# Patient Record
Sex: Female | Born: 1950 | Race: White | Hispanic: No | Smoking: Former smoker
Health system: Southern US, Community
[De-identification: ages and names within clinical notes are randomized; demographics above are authoritative.]

## PROBLEM LIST (undated history)

## (undated) DIAGNOSIS — F32A Depression, unspecified: Secondary | ICD-10-CM

## (undated) DIAGNOSIS — G8929 Other chronic pain: Secondary | ICD-10-CM

## (undated) DIAGNOSIS — F329 Major depressive disorder, single episode, unspecified: Secondary | ICD-10-CM

## (undated) DIAGNOSIS — M549 Dorsalgia, unspecified: Secondary | ICD-10-CM

## (undated) DIAGNOSIS — M810 Age-related osteoporosis without current pathological fracture: Secondary | ICD-10-CM

## (undated) DIAGNOSIS — F419 Anxiety disorder, unspecified: Secondary | ICD-10-CM

## (undated) DIAGNOSIS — R339 Retention of urine, unspecified: Secondary | ICD-10-CM

## (undated) DIAGNOSIS — M4120 Other idiopathic scoliosis, site unspecified: Secondary | ICD-10-CM

## (undated) DIAGNOSIS — K219 Gastro-esophageal reflux disease without esophagitis: Secondary | ICD-10-CM

## (undated) DIAGNOSIS — F1020 Alcohol dependence, uncomplicated: Secondary | ICD-10-CM

## (undated) DIAGNOSIS — K838 Other specified diseases of biliary tract: Secondary | ICD-10-CM

## (undated) DIAGNOSIS — M1990 Unspecified osteoarthritis, unspecified site: Secondary | ICD-10-CM

## (undated) HISTORY — PX: APPENDECTOMY: SHX54

## (undated) HISTORY — DX: Unspecified osteoarthritis, unspecified site: M19.90

## (undated) HISTORY — DX: Other specified diseases of biliary tract: K83.8

## (undated) HISTORY — PX: EUS: SHX5427

## (undated) HISTORY — DX: Other chronic pain: G89.29

## (undated) HISTORY — DX: Major depressive disorder, single episode, unspecified: F32.9

## (undated) HISTORY — DX: Alcohol dependence, uncomplicated: F10.20

## (undated) HISTORY — DX: Dorsalgia, unspecified: M54.9

## (undated) HISTORY — DX: Other idiopathic scoliosis, site unspecified: M41.20

## (undated) HISTORY — PX: ABDOMINAL HYSTERECTOMY: SHX81

## (undated) HISTORY — DX: Age-related osteoporosis without current pathological fracture: M81.0

## (undated) HISTORY — DX: Depression, unspecified: F32.A

## (undated) HISTORY — DX: Retention of urine, unspecified: R33.9

## (undated) HISTORY — DX: Gastro-esophageal reflux disease without esophagitis: K21.9

## (undated) HISTORY — DX: Anxiety disorder, unspecified: F41.9

---

## 1992-04-03 HISTORY — PX: SHOULDER OPEN ROTATOR CUFF REPAIR: SHX2407

## 1996-04-03 HISTORY — PX: BREAST ENHANCEMENT SURGERY: SHX7

## 1999-07-15 ENCOUNTER — Encounter: Payer: Self-pay | Admitting: Emergency Medicine

## 1999-07-15 ENCOUNTER — Emergency Department (HOSPITAL_COMMUNITY): Admission: EM | Admit: 1999-07-15 | Discharge: 1999-07-15 | Payer: Self-pay | Admitting: Emergency Medicine

## 1999-07-15 ENCOUNTER — Encounter (INDEPENDENT_AMBULATORY_CARE_PROVIDER_SITE_OTHER): Payer: Self-pay | Admitting: *Deleted

## 2000-03-21 ENCOUNTER — Encounter (INDEPENDENT_AMBULATORY_CARE_PROVIDER_SITE_OTHER): Payer: Self-pay | Admitting: *Deleted

## 2000-03-21 ENCOUNTER — Encounter: Payer: Self-pay | Admitting: Gastroenterology

## 2000-03-21 ENCOUNTER — Encounter: Admission: RE | Admit: 2000-03-21 | Discharge: 2000-03-21 | Payer: Self-pay | Admitting: Gastroenterology

## 2003-01-02 HISTORY — PX: BACK SURGERY: SHX140

## 2003-01-29 ENCOUNTER — Ambulatory Visit (HOSPITAL_COMMUNITY): Admission: RE | Admit: 2003-01-29 | Discharge: 2003-01-29 | Payer: Self-pay | Admitting: Neurological Surgery

## 2003-06-05 ENCOUNTER — Other Ambulatory Visit: Admission: RE | Admit: 2003-06-05 | Discharge: 2003-06-05 | Payer: Self-pay | Admitting: Internal Medicine

## 2003-06-05 ENCOUNTER — Encounter: Payer: Self-pay | Admitting: Internal Medicine

## 2003-09-21 ENCOUNTER — Encounter: Admission: RE | Admit: 2003-09-21 | Discharge: 2003-09-21 | Payer: Self-pay | Admitting: Neurological Surgery

## 2004-06-09 ENCOUNTER — Ambulatory Visit: Payer: Self-pay | Admitting: Internal Medicine

## 2005-03-23 ENCOUNTER — Ambulatory Visit: Payer: Self-pay | Admitting: Internal Medicine

## 2005-04-11 ENCOUNTER — Ambulatory Visit: Payer: Self-pay | Admitting: Internal Medicine

## 2005-07-14 ENCOUNTER — Ambulatory Visit: Payer: Self-pay | Admitting: Internal Medicine

## 2005-08-18 ENCOUNTER — Ambulatory Visit: Payer: Self-pay | Admitting: Internal Medicine

## 2005-10-10 ENCOUNTER — Ambulatory Visit: Payer: Self-pay | Admitting: Internal Medicine

## 2005-11-13 ENCOUNTER — Ambulatory Visit: Payer: Self-pay | Admitting: General Surgery

## 2005-11-14 ENCOUNTER — Ambulatory Visit: Payer: Self-pay | Admitting: General Surgery

## 2006-01-30 ENCOUNTER — Ambulatory Visit: Payer: Self-pay | Admitting: Internal Medicine

## 2006-12-19 ENCOUNTER — Encounter: Payer: Self-pay | Admitting: Internal Medicine

## 2006-12-28 ENCOUNTER — Encounter: Payer: Self-pay | Admitting: Internal Medicine

## 2007-02-07 ENCOUNTER — Telehealth: Payer: Self-pay | Admitting: Internal Medicine

## 2007-02-08 ENCOUNTER — Ambulatory Visit: Payer: Self-pay | Admitting: Family Medicine

## 2007-02-08 DIAGNOSIS — R339 Retention of urine, unspecified: Secondary | ICD-10-CM | POA: Insufficient documentation

## 2007-02-08 DIAGNOSIS — R5381 Other malaise: Secondary | ICD-10-CM | POA: Insufficient documentation

## 2007-02-08 DIAGNOSIS — R5383 Other fatigue: Secondary | ICD-10-CM

## 2007-02-08 LAB — CONVERTED CEMR LAB
Basophils Absolute: 0.1 10*3/uL (ref 0.0–0.1)
Basophils Relative: 0.7 % (ref 0.0–1.0)
Bilirubin, Direct: 0.1 mg/dL (ref 0.0–0.3)
CO2: 31 meq/L (ref 19–32)
Calcium: 9.8 mg/dL (ref 8.4–10.5)
Chloride: 101 meq/L (ref 96–112)
Creatinine, Ser: 0.6 mg/dL (ref 0.4–1.2)
GFR calc Af Amer: 133 mL/min
Glucose, Bld: 66 mg/dL — ABNORMAL LOW (ref 70–99)
HCT: 42.1 % (ref 36.0–46.0)
Ketones, urine, test strip: NEGATIVE
Lymphocytes Relative: 26.9 % (ref 12.0–46.0)
MCHC: 33.9 g/dL (ref 30.0–36.0)
MCV: 91.7 fL (ref 78.0–100.0)
Nitrite: NEGATIVE
Potassium: 4.8 meq/L (ref 3.5–5.1)
Sodium: 141 meq/L (ref 135–145)
Specific Gravity, Urine: 1.01
Urobilinogen, UA: NEGATIVE
WBC: 10.2 10*3/uL (ref 4.5–10.5)
pH: 7.5

## 2007-02-11 ENCOUNTER — Encounter: Payer: Self-pay | Admitting: Internal Medicine

## 2007-03-12 ENCOUNTER — Ambulatory Visit: Payer: Self-pay | Admitting: Internal Medicine

## 2007-04-12 ENCOUNTER — Telehealth (INDEPENDENT_AMBULATORY_CARE_PROVIDER_SITE_OTHER): Payer: Self-pay | Admitting: *Deleted

## 2007-05-16 ENCOUNTER — Telehealth (INDEPENDENT_AMBULATORY_CARE_PROVIDER_SITE_OTHER): Payer: Self-pay | Admitting: *Deleted

## 2007-05-17 ENCOUNTER — Ambulatory Visit: Payer: Self-pay | Admitting: Internal Medicine

## 2007-05-17 DIAGNOSIS — S93409A Sprain of unspecified ligament of unspecified ankle, initial encounter: Secondary | ICD-10-CM | POA: Insufficient documentation

## 2007-05-23 ENCOUNTER — Encounter: Payer: Self-pay | Admitting: Internal Medicine

## 2007-07-30 ENCOUNTER — Ambulatory Visit: Payer: Self-pay | Admitting: Obstetrics and Gynecology

## 2008-07-09 ENCOUNTER — Encounter: Payer: Self-pay | Admitting: Internal Medicine

## 2008-07-21 ENCOUNTER — Encounter: Payer: Self-pay | Admitting: Internal Medicine

## 2008-07-21 DIAGNOSIS — M4120 Other idiopathic scoliosis, site unspecified: Secondary | ICD-10-CM | POA: Insufficient documentation

## 2008-07-21 DIAGNOSIS — F39 Unspecified mood [affective] disorder: Secondary | ICD-10-CM | POA: Insufficient documentation

## 2008-07-21 DIAGNOSIS — R1011 Right upper quadrant pain: Secondary | ICD-10-CM | POA: Insufficient documentation

## 2008-07-22 ENCOUNTER — Ambulatory Visit: Payer: Self-pay | Admitting: Internal Medicine

## 2008-07-22 ENCOUNTER — Encounter: Payer: Self-pay | Admitting: Gastroenterology

## 2008-07-22 DIAGNOSIS — F1910 Other psychoactive substance abuse, uncomplicated: Secondary | ICD-10-CM | POA: Insufficient documentation

## 2008-07-22 DIAGNOSIS — F1011 Alcohol abuse, in remission: Secondary | ICD-10-CM | POA: Insufficient documentation

## 2008-07-22 LAB — CONVERTED CEMR LAB
ALT: 24 units/L (ref 0–35)
Albumin: 4.4 g/dL (ref 3.5–5.2)
Amylase: 43 units/L (ref 27–131)
INR: 1 (ref 0.8–1.0)
Lipase: 19 units/L (ref 11.0–59.0)
Total Bilirubin: 0.4 mg/dL (ref 0.3–1.2)
Total Protein: 7.5 g/dL (ref 6.0–8.3)

## 2008-07-23 ENCOUNTER — Ambulatory Visit (HOSPITAL_COMMUNITY): Admission: RE | Admit: 2008-07-23 | Discharge: 2008-07-23 | Payer: Self-pay | Admitting: Gastroenterology

## 2008-07-23 ENCOUNTER — Ambulatory Visit: Payer: Self-pay | Admitting: Gastroenterology

## 2008-08-03 ENCOUNTER — Telehealth: Payer: Self-pay | Admitting: Internal Medicine

## 2008-09-03 ENCOUNTER — Ambulatory Visit: Payer: Self-pay | Admitting: Internal Medicine

## 2008-09-03 DIAGNOSIS — K6289 Other specified diseases of anus and rectum: Secondary | ICD-10-CM | POA: Insufficient documentation

## 2008-09-07 ENCOUNTER — Encounter: Payer: Self-pay | Admitting: Gastroenterology

## 2008-09-24 ENCOUNTER — Telehealth (INDEPENDENT_AMBULATORY_CARE_PROVIDER_SITE_OTHER): Payer: Self-pay | Admitting: *Deleted

## 2008-09-24 ENCOUNTER — Encounter: Payer: Self-pay | Admitting: Gastroenterology

## 2008-09-24 DIAGNOSIS — K831 Obstruction of bile duct: Secondary | ICD-10-CM | POA: Insufficient documentation

## 2008-09-30 ENCOUNTER — Telehealth: Payer: Self-pay | Admitting: Cardiology

## 2008-10-15 ENCOUNTER — Ambulatory Visit: Payer: Self-pay | Admitting: Gastroenterology

## 2008-10-15 ENCOUNTER — Encounter (INDEPENDENT_AMBULATORY_CARE_PROVIDER_SITE_OTHER): Payer: Self-pay | Admitting: *Deleted

## 2008-10-15 ENCOUNTER — Ambulatory Visit (HOSPITAL_COMMUNITY): Admission: RE | Admit: 2008-10-15 | Discharge: 2008-10-15 | Payer: Self-pay | Admitting: Gastroenterology

## 2008-10-22 ENCOUNTER — Ambulatory Visit: Payer: Self-pay | Admitting: Internal Medicine

## 2009-03-11 ENCOUNTER — Telehealth: Payer: Self-pay | Admitting: Internal Medicine

## 2009-03-12 ENCOUNTER — Encounter: Payer: Self-pay | Admitting: Internal Medicine

## 2009-04-01 ENCOUNTER — Telehealth: Payer: Self-pay | Admitting: Internal Medicine

## 2009-04-05 ENCOUNTER — Ambulatory Visit: Payer: Self-pay | Admitting: Internal Medicine

## 2009-04-05 DIAGNOSIS — K5900 Constipation, unspecified: Secondary | ICD-10-CM | POA: Insufficient documentation

## 2009-04-05 DIAGNOSIS — R1031 Right lower quadrant pain: Secondary | ICD-10-CM | POA: Insufficient documentation

## 2009-04-06 ENCOUNTER — Encounter: Payer: Self-pay | Admitting: Internal Medicine

## 2009-04-07 ENCOUNTER — Ambulatory Visit: Payer: Self-pay | Admitting: Cardiovascular Disease

## 2009-04-08 LAB — CONVERTED CEMR LAB
AST: 28 units/L (ref 0–37)
Albumin: 4.2 g/dL (ref 3.5–5.2)
BUN: 12 mg/dL (ref 6–23)
Basophils Absolute: 0.1 10*3/uL (ref 0.0–0.1)
CO2: 33 meq/L — ABNORMAL HIGH (ref 19–32)
Calcium: 9.6 mg/dL (ref 8.4–10.5)
Chloride: 98 meq/L (ref 96–112)
Creatinine, Ser: 0.6 mg/dL (ref 0.4–1.2)
Eosinophils Relative: 1.2 % (ref 0.0–5.0)
GFR calc non Af Amer: 109.41 mL/min (ref >60)
HCT: 40.3 % (ref 36.0–46.0)
MCHC: 33.4 g/dL (ref 30.0–36.0)
Monocytes Absolute: 0.4 10*3/uL (ref 0.1–1.0)
Monocytes Relative: 5.2 % (ref 3.0–12.0)
Neutro Abs: 3.9 10*3/uL (ref 1.4–7.7)
Potassium: 4.6 meq/L (ref 3.5–5.1)
RDW: 13.1 % (ref 11.5–14.6)
Sodium: 138 meq/L (ref 135–145)

## 2009-04-09 ENCOUNTER — Telehealth: Payer: Self-pay | Admitting: Internal Medicine

## 2009-04-15 ENCOUNTER — Telehealth: Payer: Self-pay | Admitting: Internal Medicine

## 2009-04-29 ENCOUNTER — Telehealth: Payer: Self-pay | Admitting: Internal Medicine

## 2009-05-07 ENCOUNTER — Ambulatory Visit: Payer: Self-pay | Admitting: Internal Medicine

## 2009-05-07 ENCOUNTER — Ambulatory Visit (HOSPITAL_COMMUNITY): Admission: RE | Admit: 2009-05-07 | Discharge: 2009-05-07 | Payer: Self-pay | Admitting: Internal Medicine

## 2009-05-07 LAB — HM COLONOSCOPY

## 2010-02-11 ENCOUNTER — Ambulatory Visit: Payer: Self-pay | Admitting: Internal Medicine

## 2010-02-11 DIAGNOSIS — M19049 Primary osteoarthritis, unspecified hand: Secondary | ICD-10-CM | POA: Insufficient documentation

## 2010-02-14 LAB — CONVERTED CEMR LAB: Anti Nuclear Antibody(ANA): NEGATIVE

## 2010-02-15 ENCOUNTER — Telehealth: Payer: Self-pay | Admitting: Internal Medicine

## 2010-02-15 LAB — CONVERTED CEMR LAB
ALT: 25 units/L (ref 0–35)
AST: 28 units/L (ref 0–37)
BUN: 13 mg/dL (ref 6–23)
Basophils Relative: 1.5 % (ref 0.0–3.0)
Chloride: 99 meq/L (ref 96–112)
Creatinine, Ser: 0.6 mg/dL (ref 0.4–1.2)
Eosinophils Relative: 6.3 % — ABNORMAL HIGH (ref 0.0–5.0)
GFR calc non Af Amer: 101.25 mL/min (ref >60)
HCT: 38.7 % (ref 36.0–46.0)
Hemoglobin: 13.1 g/dL (ref 12.0–15.0)
MCHC: 34 g/dL (ref 30.0–36.0)
MCV: 90.8 fL (ref 78.0–100.0)
Phosphorus: 4.1 mg/dL (ref 2.3–4.6)
Potassium: 4 meq/L (ref 3.5–5.1)
RBC: 4.26 M/uL (ref 3.87–5.11)
Sed Rate: 8 mm/hr (ref 0–22)
Total Bilirubin: 0.5 mg/dL (ref 0.3–1.2)

## 2010-02-16 ENCOUNTER — Encounter: Payer: Self-pay | Admitting: Internal Medicine

## 2010-03-11 ENCOUNTER — Telehealth: Payer: Self-pay | Admitting: Internal Medicine

## 2010-03-15 ENCOUNTER — Ambulatory Visit: Payer: Self-pay | Admitting: Internal Medicine

## 2010-04-25 ENCOUNTER — Telehealth: Payer: Self-pay | Admitting: Internal Medicine

## 2010-05-03 NOTE — Assessment & Plan Note (Signed)
Summary: RE-ESTABLISH, PAIN IN JOINTS   Vital Signs:  Patient profile:   60 year old female Height:      64.5 inches Weight:      104 pounds BMI:     17.64 Temp:     98.0 degrees F oral Pulse rate:   76 / minute Pulse rhythm:   regular BP sitting:   110 / 60  (left arm) Cuff size:   regular  Vitals Entered By: Mervin Hack CMA Duncan Dull) (February 11, 2010 12:13 PM) CC: pain in joints, re-establish care   History of Present Illness: Reestablishing here Had been seen once at [facility] in [place]. Wasn't happy there Sees Dr Synetta Shadow at [facility] pain clinic Chronic pain patient has reasonable coontrol does get worsening several months after caudal steroid shots---gets about every 5 months  Has noted rapid, fast moving arthritis Had pain in hands all summer then noted increasing stiffness in AM--loosens up in 15 minutes or so Pain keeping her up at night despite her meds elbows, knees and feet at times---moves around very fast  some help from accupuncturist  Under a lot of stress Cares for 23 year old father--has to cook for him, etc. Lives 5 minutes away Marriage isn't very good  No preceeding viral or other infection  No photosensitivity No swallowing problems  no rashes  Allergies: 1)  ! Sulfa  Past History:  Past medical, surgical, family and social histories (including risk factors) reviewed for relevance to current acute and chronic problems.  Past Medical History: Chronic back pain--disabled     Followed at pain clinic SCOLIOSIS (ICD-737.30) ANXIETY (ICD-300.00) * COMMON BILE DUCT DILATION URINARY RETENTION (ICD-788.20) Alcoholism Arthritis Depression  Past Surgical History: Reviewed history from 04/05/2009 and no changes required. 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 Abd problems that she is seeing GI for  Physical Exam  General:  alert.  NAD Head:  normocephalic and atraumatic.   Neck:  supple, no masses, no thyromegaly, no carotid bruits, and no cervical lymphadenopathy.   Lungs:  normal respiratory effort, no intercostal retractions, no accessory muscle use, and normal breath sounds.   Heart:  normal rate, regular rhythm, no murmur, and no gallop.   Abdomen:  soft, non-tender, no hepatomegaly, and no splenomegaly.   Msk:  symmetric mild synovitis in 2-5th PIPs in both hands all other joints are quiet --though occ mild tenderness Extremities:  No edema Skin:  no rashes.   Psych:  normally interactive, good eye contact, and dysphoric affect.     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) Assessment New x-rays look okay to me but awaiting radiologist symmetric small joints suggestive of RA multiple other pains as well  will try NSAIDs--she does have some upset stomach at times if tests all negative but ongoing symptoms, will send to  rheumatology she is very wary of disease modifying regimens though I told her they could be very successful  Orders: T-Hand Left 3 Views (73130TC) T-Hand Right 3 views (73130TC) Venipuncture (44010) TLB-Renal Function Panel (80069-RENAL) TLB-CBC Platelet - w/Differential (85025-CBCD) TLB-Hepatic/Liver  Function Pnl (80076-HEPATIC) TLB-TSH (Thyroid Stimulating Hormone) (84443-TSH) Specimen Handling (27253) TLB-Sedimentation Rate (ESR) (85652-ESR) T-Antinuclear Antib (ANA) (66440-34742) T-Rheumatoid Factor (59563-87564)  Complete Medication List: 1)  Klonopin 0.5 Mg Tabs (Clonazepam) .... Take 1 tablet by mouth once a day 2)  Methadone Hcl 10 Mg Tabs (Methadone hcl) .... Take 2 by mouth in the morning, take 1 by mouth in the afternoon, take 2 by mouth at bedtime 3)  Hydrocodone-acetaminophen 7.5-750 Mg Tabs (Hydrocodone-acetaminophen) .... Two times a day 4)  Vitamin E 400 Unit Caps (Vitamin e) .... Take 1 capsule by mouth once a day 5)  Calcium-vitamin D 600-200 Mg-unit Tabs (Calcium-vitamin d) .... Once daily 6)  Vitamin B-6 50 Mg Tabs (Pyridoxine hcl) .... Once daily 7)  Ra Gas Relief Maximum Strength 125 Mg Chew (Simethicone) .... Once daily 8)  Miralax Powd (Polyethylene glycol 3350) .... Take 1 capful dissolved in water/juice once daily.  Patient Instructions: 1)  Please try aleve (naproxen sodium 220mg ) 1-2 tabs after breakfast and supper 2)  Please schedule a follow-up appointment in 1 month.    Orders Added: 1)  T-Hand Left 3 Views [73130TC] 2)  T-Hand Right 3 views [73130TC] 3)  Venipuncture [36415] 4)  TLB-Renal Function Panel [80069-RENAL] 5)  TLB-CBC Platelet - w/Differential [85025-CBCD] 6)  TLB-Hepatic/Liver Function Pnl [80076-HEPATIC] 7)  TLB-TSH (Thyroid Stimulating Hormone) [84443-TSH] 8)  Specimen Handling [99000] 9)  TLB-Sedimentation Rate (ESR) [85652-ESR] 10)  T-Antinuclear Antib (ANA) [33295-18841] 11)  T-Rheumatoid Factor [66063-01601] 12)  Est. Patient Level IV [09323]    Current Allergies (reviewed today): ! SULFA

## 2010-05-03 NOTE — Progress Notes (Signed)
Summary: regarding lab results  Phone Note Call from Patient Call back at Home Phone [phone]   Caller: Patient Call For: Cindee Salt MD Summary of Call: Advised pt of lab results.  She says she cant take the aleve because it upsets her stomach.  She cant sleep at night because she breaks out in sweats  and her hands hurt so bad.  She will try the aleve again tonight but asks if you have any other ideas. Initial call taken by: Lowella Petties CMA, AAMA,  February 15, 2010 11:11 AM  Follow-up for Phone Call        she needs to come in so we can decide on a treatment for her I would recommend methotrexate she should try ibuprofen 1-2 three times a day with meals also Cindee Salt MD  February 15, 2010 11:34 AM   Advised pt. She wants to hold off on appt.  she said she will try to eat more and take the aleve but she is asking for something to help her sleep, says she is up all night with her hand pain.  She doesnt want to start methotrexate at this time, she is getting accupunture treatments and wants to continue those instead. Follow-up by: Lowella Petties CMA, AAMA,  February 15, 2010 12:13 PM  Additional Follow-up for Phone Call Additional follow up Details #1::        okay to refill the hydrocodone #60 x 0 then  Cindee Salt MD  February 15, 2010 1:39 PM   Pt states she gets her vicodin from the pain clinic.  She is asking for something for sleep.  [facility] [place] Additional Follow-up by: Lowella Petties CMA, AAMA,  February 15, 2010 3:16 PM    Additional Follow-up for Phone Call Additional follow up Details #2::    That's right Have her try trazodone 50mg   1-2 at bedtime to help sleep #60 x 1 Cindee Salt MD  February 16, 2010 7:57 AM    rx faxed to pharmacy, left message on machine that rx was sent to pharmacy, advised pt to call if any questions. Follow-up by: Mervin Hack CMA Duncan Dull),  February 16, 2010 3:57 PM  New/Updated  Medications: TRAZODONE HCL 50 MG TABS (TRAZODONE HCL) 1-2 at bedtime to help sleep Prescriptions: TRAZODONE HCL 50 MG TABS (TRAZODONE HCL) 1-2 at bedtime to help sleep  #60 x 1   Entered by:   Mervin Hack CMA (AAMA)   Authorized by:   Cindee Salt MD   Signed by:   Mervin Hack CMA (AAMA) on 02/16/2010   Method used:   [facility]  [address] [address] [phone][facility]       [address]       [place], [place]  [address]       Ph: [phone] or [phone]       Fax: [phone]   RxID:   [id number]

## 2010-05-03 NOTE — Procedures (Signed)
Summary: Colonoscopy  Patient: Katherine [name] Note: All result statuses are Final unless otherwise noted.  Tests: (1) Colonoscopy (COL)   COL Colonoscopy           DONE     [facility]     [address] [place], [place]  [address]           COLONOSCOPY PROCEDURE REPORT           PATIENT:  [name], [name]  MR#:  [id number]     BIRTHDATE:  1950/06/30, 58 yrs. old  GENDER:  female           ENDOSCOPIST:  Hedwig Morton. Juanda Chance, MD     Referred by:           PROCEDURE DATE:  05/07/2009     PROCEDURE:  Colonoscopy 19147     ASA CLASS:  Class II     INDICATIONS:  abdominal pain RLQ abd. pain, chronic constipation,     on Methadone for chronic pain           MEDICATIONS:   See Anesthesia Report., Versed 2 mg, Fentanyl 50     mcg, propofol (Diprivan) 220 mg           DESCRIPTION OF PROCEDURE:   After the risks benefits and     alternatives of the procedure were thoroughly explained, informed     consent was obtained.  Digital rectal exam was performed and     revealed no rectal masses.   The  endoscope was introduced through     the anus and advanced to the cecum, which was identified by both     the appendix and ileocecal valve, without limitations.  The     quality of the prep was excellent, using MiraLax.  The instrument     was then slowly withdrawn as the colon was fully examined.     <<PROCEDUREIMAGES>>           FINDINGS:  No polyps or cancers were seen (see image1, image2,     image3, and image4).   Retroflexed views in the rectum revealed no     abnormalities.    The scope was then withdrawn from the patient     and the procedure completed.           COMPLICATIONS:  None           ENDOSCOPIC IMPRESSION:     1) No polyps or cancers     2) Normal colonoscopy     RECOMMENDATIONS:     1) high fiber diet     Miralax 17 gm po qd for constipation           REPEAT EXAM:  In 10 year(s) for.           ______________________________     Hedwig Morton. Juanda Chance, MD        CC:           n.     eSIGNED:   Hedwig Morton. Brodie at 05/07/2009 09:44 AM           Scarlata, Madrone, [phone]  Note: An exclamation mark (!) indicates a result that was not dispersed into the flowsheet. Document Creation Date: 05/07/2009 9:45 AM _______________________________________________________________________  (1) Order result status: Final Collection or observation date-time: 05/07/2009 09:27 Requested date-time:  Receipt date-time:  Reported date-time:  Referring Physician:   Ordering Physician: Lina Sar [phone]) Specimen Source:  Source: Launa Grill  Order Number: [id number] Lab site:   Appended Document: Colonoscopy Recall is in [facility] for 05/2019.

## 2010-05-03 NOTE — Letter (Signed)
Summary: [facility] Instructions  [facility]  [address] [place], [place] [address]   Phone: [phone]  Fax: [phone]       Katherine [name]    26-Dec-1950    MRN: [id number]       Procedure Day /Date:05-07-09     Arrival Time: 7:00 AM     Procedure Time:8:30 AM     Location of Procedure:                     X     [facility] ( Outpatient Registration)      PREPARATION FOR COLONOSCOPY WITH MIRALAX  Starting 5 days prior to your procedure 05-02-09 do not eat nuts, seeds, popcorn, corn, beans, peas,  salads, or any raw vegetables.  Do not take any fiber supplements (e.g. Metamucil, Citrucel, and Benefiber). ____________________________________________________________________________________________________   THE DAY BEFORE YOUR PROCEDURE         DATE:05-06-09 DAY: Thursday  You should come to the Admitting department at [facility] at 1:00 PM for directions to the Endoscopy Unit on the 1st floor for the Preappointment regarding Anesthesia.  1   Drink clear liquids the entire day-NO SOLID FOOD  2   Do not drink anything colored red or purple.  Avoid juices with pulp.  No orange juice.  3   Drink at least 64 oz. (8 glasses) of fluid/clear liquids during the day to prevent dehydration and help the prep work efficiently.  CLEAR LIQUIDS INCLUDE: Water Jello Ice Popsicles Tea (sugar ok, no milk/cream) Powdered fruit flavored drinks Coffee (sugar ok, no milk/cream) Gatorade Juice: apple, white grape, white cranberry  Lemonade Clear bullion, consomm, broth Carbonated beverages (any kind) Strained chicken noodle soup Hard Candy  4   Mix the entire bottle of Miralax with 64 oz. of Gatorade/Powerade in the morning and put in the refrigerator to chill.  5   At 3:00 pm take 2 Dulcolax/Bisacodyl tablets.  6   At 4:30 pm take one Reglan/Metoclopramide tablet.  7  Starting at 5:00 pm drink one 8 oz glass of the Miralax mixture every 15-20 minutes  until you have finished drinking the entire 64 oz.  You should finish drinking prep around 7:30 or 8:00 pm.  8   If you are nauseated, you may take the 2nd Reglan/Metoclopramide tablet at 6:30 pm.        9    At 8:00 pm take 2 more DULCOLAX/Bisacodyl tablets.     THE DAY OF YOUR PROCEDURE      DATE:  05-07-09 DAY: Friday  You may drink clear liquids until Midnight.   MEDICATION INSTRUCTIONS  Unless otherwise instructed, you should take regular prescription medications with a small sip of water as early as possible the morning of your procedure.       OTHER INSTRUCTIONS  You will need a responsible adult at least 59 years of age to accompany you and drive you home.   This person must remain in the waiting room during your procedure.  Wear loose fitting clothing that is easily removed.  Leave jewelry and other valuables at home.  However, you may wish to bring a book to read or an iPod/MP3 player to listen to music as you wait for your procedure to start.  Remove all body piercing jewelry and leave at home.  Total time from sign-in until discharge is approximately 2-3 hours.  You should go home directly after your procedure and rest.  You  can resume normal activities the day after your procedure.  The day of your procedure you should not:   Drive   Make legal decisions   Operate machinery   Drink alcohol   Return to work  You will receive specific instructions about eating, activities and medications before you leave.   The above instructions have been reviewed and explained to me by   _______________________    I fully understand and can verbalize these instructions _____________________________ Date _______

## 2010-05-03 NOTE — Assessment & Plan Note (Signed)
Summary: F/U FROM TRIAGE ON 03-11-09, CONTINUES WITH SEVERE PAIN (DR.BR...   History of Present Illness Visit Type: Follow-up Visit Primary GI MD: Lina Sar MD Primary Provider: Darrel Hoover, DO Chief Complaint: abdominal pain with protusion History of Present Illness:   60 YO FEMALE WITH MULTIPLE MEDICAL PROBLEMS,KNOWN TO DR Juanda Chance. SHE UNDERWENT EUS AND ERCP WITH STENT PLACEMENT PER DR JACOBS IN 4/10 FOR A DISTAL BILE DUCT STRICTURE. STENT WAS REMOVED AND SHINCTEROTOMY WAS EXTENDED IN 7 /10. SHE COMES BACK TODAY WITH A DIFFERENT PROBLEM . SHE SAYS SHE HAD RECURRENCE OF HER RUQ PAIN AFTER THE STANT WAS REMOVED BUT IT HAS BEEN TOLERABLE,NOT SEVERE-SOME DAYS WORSE THAN OTHERS.. SHE HAS A NEW PAIN IN THE RLQ [facility] HAS BEEN BOTHERING HER ALOT OVER THE PAST COUPLE MONTHS. THIS PAIN IS FAIRLY CONSTANT,IS WORSE POST PRANDIALLY,RADIATES AROUND IN TO HER BACK AT TIMES. SHE FEELS SHE HAS A PROTRUSION IN THE RLQ. SHE HAS CHRONIC CONSTIPATION,USES MIRALAX DAILY SOMETIMES HAS TO USE OTHER LAXATIVE /PRUNE JUICE ETC. NO MELENA OR HEME.. SHE HAD LOST 20 POUNDS EARLIER THIS YEAR,IS WORKING HARD TO GAIN SOME BACK.SHE IS UP 3 POUNDS WITH USE OF PROTEIN SUPPLEMENTS ETC.   GI Review of Systems    Reports abdominal pain and  bloating.     Location of  Abdominal pain: RLQ.    Denies acid reflux, belching, chest pain, dysphagia with liquids, dysphagia with solids, heartburn, loss of appetite, nausea, vomiting, vomiting blood, weight loss, and  weight gain.      Reports change in bowel habits.      Current Medications (verified): 1)  Klonopin 0.5 Mg  Tabs (Clonazepam) .... Take 1 Tablet By Mouth Once A Day 2)  Methadone Hcl 10 Mg  Tabs (Methadone Hcl) .... Up To 40 Mg. Once Daily 3)  Miralax   Powd (Polyethylene Glycol 3350) .... Once Daily 4)  Skelaxin 800 Mg Tabs (Metaxalone) .... One Tablet By Mouth Once A Week 5)  Hydrocodone-Acetaminophen 7.5-750 Mg Tabs (Hydrocodone-Acetaminophen) .... Two Times A  Day 6)  Lorazepam 0.5 Mg Tabs (Lorazepam) .... One Tablet By Mouth At Bedtime 7)  Caudle Steroid Injections .... Every 5 Months 8)  Vitamin E 400 Unit Caps (Vitamin E) .... Take 1 Capsule By Mouth Once A Day 9)  Calcium-Vitamin D 600-200 Mg-Unit Tabs (Calcium-Vitamin D) .... Once Daily 10)  Vitamin B-6 50 Mg Tabs (Pyridoxine Hcl) .... Once Daily 11)  Ra Gas Relief Maximum Strength 125 Mg Chew (Simethicone) .... Once Daily  Allergies (verified): 1)  ! Sulfa  Past History:  Past Medical History: Reviewed history from 07/22/2008 and no changes required. 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 Father: bladder cancer, HTN Mother: NIDDM 2 brothers Malen Gauze died of cervical cancer CAD on Mom's side  Social History: Reviewed history from 10/22/2008 and no changes required. Marital Status: Married-going through divorce Children: none Occupation: worked part-tme at [employer] at [employer] Current Smoker--several daily Alcohol use-no  Review of Systems       The patient complains of  allergy/sinus, anxiety-new, back pain, cough, depression-new, fatigue, muscle pains/cramps, night sweats, shortness of breath, sleeping problems, and urination changes/pain.         ROS OTHERWISE AS IN HPI  Vital Signs:  Patient profile:   60 year old female Height:      65  inches Weight:      101.50 pounds BMI:     16.95 Pulse rate:   68 / minute Pulse rhythm:   regular BP sitting:   110 / 58  (left arm) Cuff size:   regular  Vitals Entered By: June McMurray CMA Duncan Dull) (April 05, 2009 2:24 PM)  Physical Exam  General:  Well developed, , no acute distress.,VERY THIN,AMBULATES WITH A CANE Head:  Normocephalic and atraumatic. Eyes:  PERRLA, no icterus. Lungs:  Clear throughout to auscultation. Heart:  Regular rate and rhythm; no murmurs, rubs,  or bruits. Abdomen:  FLAT SOFT, MILD TENDERNESS RLQ, NO HERNIA PALPABLE, NO MAS OR HSM,BS+ Rectal:  NOT DONE Neurologic:  Alert and  oriented x4;  grossly normal neurologically.weakness noted.  weakness noted.   Psych:  Alert and cooperative. Normal mood and affect.   Impression & Recommendations:  Problem # 1:  ABDOMINAL PAIN RIGHT LOWER QUADRANT (ICD-789.03) Assessment New 60 YO FEMALE WITH CHRONIC PAIN,ON METHDONE WITH RLQ PAIN X 2 MONTHS. R/O SECONDARY TO CONSTIPATION, LOW GRADE PARTIAL SBO,ADHESIONS,OCCULT LESION,R/O MUSCULOSKELETAL  LABS AS BELOW SCHEDULE FOR CT SCAN ABD/PELVIS  IF CT UNREMARKABLE WILL PROCEED WITH COLONOSCOPY WITH DR Juanda Chance. PT HAS NOT HAD ANY PRIOR COLON SCREENING. WILL NEED MAC ANESTHESIA. PROCEDURE DISCUSSED WITH PT AND HUSBAND  Orders: ZCOL (ZCOL)  Problem # 2:  CONSTIPATION (ICD-564.00) Assessment: Unchanged CHRONIC;SEEABOVE. ADVISED INCREASE CHRONULAC TO 2 DOSESDAILY AS NEEDED.  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) Assessment: Unchanged RECURRENT RUQ PAIN,NO CHANGE X SEVERAL MONTHS;HX OF DIFFUSELY DILATED CBD?SOD-S/P ERCP/SPHINCTEROTOMY/STENT 4/10 WITH STENT REMOVAL 7/10  HEPATIC PANEL TODAY CT ABDOMEN PELVIS AS ABOVE Orders: TLB-CMP (Comprehensive Metabolic Pnl) (80053-COMP) TLB-CBC Platelet - w/Differential (85025-CBCD) ZCOL (ZCOL)  Problem # 4:  FAMILY HX OF COLONIC POLYPS (ICD-V18.51) Assessment: Comment Only  Problem # 5:  ABUSE, ALCOHOL, IN  REMISSION (ICD-305.03) Assessment: Comment Only  Problem # 6:  CHRONIC PAIN SYNDROME Assessment: Comment Only  Patient Instructions: 1)  We scheduled the CT for 04-07-09 at 10:30Am. 2)  Instructions and contrast provided. 3)  We scheduled the Colonoscopy at [facility] with Dr. Lina Sar for 05-07-09 at 8:30 AM. 4)  Colonoscopy and conscous sedation brochure provided. 5)  The medication list was reviewed and reconciled.  All changed / newly prescribed medications were explained.  A complete medication list was provided to the patient / caregiver. Prescriptions: REGLAN 10 MG  TABS (METOCLOPRAMIDE HCL) As per prep instructions.  #2 x 0   Entered by:   Lowry Ram NCMA   Authorized by:   Sammuel Cooper PA-c   Signed by:   Lowry Ram NCMA on 04/05/2009   Method used:   [facility]  [address] [phone]       [address]       [place], [place]  [address]       Ph: [phone] or [phone]       Fax: [phone]   RxID:   [id number] DULCOLAX 5 MG  TBEC (BISACODYL) Day before procedure take 2 at 3pm and 2 at 8pm.  #  4 x 0   Entered by:   Lowry Ram NCMA   Authorized by:   Sammuel Cooper PA-c   Signed by:   Lowry Ram NCMA on 04/05/2009   Method used:   [facility]  [address] [phone][facility] (retail[facility]       [address]       [place], [place]  [address]       Ph: [phone] or [phone]       Fax: [phone]   RxID:   [id number] MIRALAX   POWD (POLYETHYLENE GLYCOL 3350) As per prep  instructions.  #255gm x 0   Entered by:   Lowry Ram NCMA   Authorized by:   Sammuel Cooper PA-c   Signed by:   Lowry Ram NCMA on 04/05/2009   Method used:   [facility]  [address] [phone]* (retail)       [address]       [place], [place]  [address]       Ph: [phone] or [phone]       Fax: [phone]   RxID:   [id number]

## 2010-05-03 NOTE — Miscellaneous (Signed)
Summary: CT  Clinical Lists Changes  Orders: Added new Referral order of CT Abdomen/Pelvis with Contrast (CT Abd/Pelvis w/con) - Signed

## 2010-05-03 NOTE — Procedures (Signed)
Summary: Instructions for procedure/[facility] (out pt)  Instructions for procedure/[facility] (out pt)   Imported By: Sherian Rein 04/08/2009 11:18:05  _____________________________________________________________________  External Attachment:    Type:   Image     Comment:   External Document

## 2010-05-03 NOTE — Procedures (Signed)
Summary: Recall / [facility]  Recall / [facility]   Imported By: Lennie Odor 09/03/2009 14:09:29  _____________________________________________________________________  External Attachment:    Type:   Image     Comment:   External Document

## 2010-05-03 NOTE — Progress Notes (Signed)
Summary: Refill  Phone Note Call from Patient Call back at Home Phone [phone] Call back at or [phone]   Call For: Dr Juanda Chance Reason for Call: Talk to Nurse Summary of Call: On social security and has to pay her primary a copay just so he can refill her Polyglycol. Wonders if Dr Juanda Chance can just [facility] [place] [facility] [address] Dr Juanda Chance knows she uses this. Initial call taken by: Leanor Kail [facility],  April 15, 2009 3:46 PM    New/Updated Medications: MIRALAX   POWD (POLYETHYLENE GLYCOL 3350) Take 1 capful dissolved in water/juice once daily. MUST HAVE COLONOSCOPY FOR FURTHER REFILLS! Prescriptions: MIRALAX   POWD (POLYETHYLENE GLYCOL 3350) Take 1 capful dissolved in water/juice once daily. MUST HAVE COLONOSCOPY FOR FURTHER REFILLS!  #527 grams x 0   Entered by:   Hortense Ramal CMA (AAMA)   Authorized by:   Hart Carwin MD   Signed by:   Hortense Ramal CMA (AAMA) on 04/15/2009   Method used:   [facility]        [facility]  [address] [phone][facility] (retail[facility]       [address]       [place], [place]  [address]       Ph: [phone] or [phone]       Fax: [phone]   RxID:   [id number]

## 2010-05-03 NOTE — Progress Notes (Signed)
Summary: TRIAGE  Phone Note Call from Patient Call back at Home Phone [phone]   Call For: Dr Juanda Chance Reason for Call: Lab or Test Results Summary of Call: Was her CT Scan reviewed? Initial call taken by: Leanor Kail [facility],  April 09, 2009 11:37 AM  Follow-up for Phone Call        CT results reviewed w/pt. Pt. continues to have same symptoms--Pain from RUQ to LLQ. "Just like it hurt before, when they put the stent in. It hurts from my gallbladder down to my lower left side and it gets hard after I eat"  "Is my bile duct too small again?"   DR.Laurielle Selmon PLEASE ADVISE  Follow-up by: Laureen Ochs LPN,  April 09, 2009 11:52 AM  Additional Follow-up for Phone Call Additional follow up Details #1::        She is a chronic pain patient., her bile duct looks the same as  before and her LFT's are normal. I don't want to be her pain doctor Additional Follow-up by: Hart Carwin MD,  April 09, 2009 1:12 PM    Additional Follow-up for Phone Call Additional follow up Details #2::    Above MD orders reviewed with patient.Pt. instructed to call back as needed.  Follow-up by: Laureen Ochs LPN,  April 09, 2009 1:57 PM

## 2010-05-03 NOTE — Progress Notes (Signed)
Summary: pt cancelled appt  Phone Note Call from Patient Call back at Home Phone [phone]   Caller: Patient Call For: Cindee Salt MD Summary of Call: Pt had an appt to see you next week for a follow up but she wanted you to know that she has cancelled this.  She has been getting accupuncture treatments for her arthritis and that is helping with the pain and helping her sleep.  She is also taking chinese herbs.  She will call you back if the accupuncture stops helping. Initial call taken by: Lowella Petties CMA, AAMA,  March 11, 2010 11:31 AM  Follow-up for Phone Call        okay Follow-up by: Cindee Salt MD,  March 11, 2010 11:37 AM

## 2010-05-03 NOTE — Letter (Signed)
Summary: [facility]  [facility]   Imported By: Maryln Gottron 02/28/2010 13:25:37  _____________________________________________________________________  External Attachment:    Type:   Image     Comment:   External Document  Appended Document: [facility] Note methadone and hydrocodone refilled

## 2010-05-03 NOTE — Progress Notes (Signed)
Summary: speak to nurse re prep  Phone Note Call from Patient Call back at Home Phone [phone]   Caller: Patient Call For: Juanda Chance Reason for Call: Talk to Nurse Summary of Call: Patient has questions regarding prep  Initial call taken by: Tawni Levy,  April 29, 2009 4:25 PM  Follow-up for Phone Call        Pt states she takes Miralax daily for BMs; states she "accidently took the small bottle of Miralax" that was intended for Prep.  Spoke with Dr. Juanda Chance and situation explained (see phone note from 04-15-09 for further explanation) and ok received for MIralax to be sent.   Follow-up by: Karl Bales RN,  April 29, 2009 5:16 PM

## 2010-05-05 NOTE — Progress Notes (Signed)
Summary: order for mammogram   Phone Note Call from Patient Call back at Home Phone [phone]   Caller: Patient Call For: Cindee Salt MD Summary of Call: Patient says that she is due for her mammogram and she is asking if she can get an order sent to [facility] where she normally gets them done. The fax number is [phone]. She will schedule her own appt.  Initial call taken by: Melody Comas,  April 25, 2010 4:22 PM  New Problems: OTHER SCREENING MAMMOGRAM (ICD-V76.12)   New Problems: OTHER SCREENING MAMMOGRAM (ICD-V76.12)

## 2010-05-16 ENCOUNTER — Encounter: Payer: Self-pay | Admitting: Internal Medicine

## 2010-05-23 ENCOUNTER — Encounter: Payer: Self-pay | Admitting: Internal Medicine

## 2010-05-31 NOTE — Letter (Signed)
Summary: Results Follow up Letter  [place] [facility]  [address] [place], [place] [address]   Phone: [phone]  Fax: [phone]    05/23/2010 MRN: [id number]  [facility] [address] [place], [place]  [address]  Dear Ms. Einar Gip,  The following are the results of your recent test(s):  Test         Result    Pap Smear:        Normal _____  Not Normal _____ Comments: ______________________________________________________ Cholesterol: LDL(Bad cholesterol):         Your goal is less than:         HDL (Good cholesterol):       Your goal is more than: Comments:  ______________________________________________________ Mammogram:        Normal __X__  Not Normal _____ Comments:mammo looks fine Repeat recommended in 1-2 years  ___________________________________________________________________ Hemoccult:        Normal _____  Not normal _______ Comments:    _____________________________________________________________________ Other Tests:    We routinely do not discuss normal results over the telephone.  If you desire a copy of the results, or you have any questions about this information we can discuss them at your next office visit.   Sincerely,      Tillman Abide, MD

## 2010-07-13 LAB — BASIC METABOLIC PANEL
Calcium: 9.6 mg/dL (ref 8.4–10.5)
Chloride: 108 mEq/L (ref 96–112)
Glucose, Bld: 85 mg/dL (ref 70–99)
Potassium: 4.3 mEq/L (ref 3.5–5.1)
Sodium: 142 mEq/L (ref 135–145)

## 2010-07-13 LAB — CBC
MCV: 91.1 fL (ref 78.0–100.0)
Platelets: 193 10*3/uL (ref 150–400)
RBC: 4.44 MIL/uL (ref 3.87–5.11)
RDW: 14 % (ref 11.5–15.5)

## 2010-07-13 LAB — DIFFERENTIAL
Basophils Absolute: 0 10*3/uL (ref 0.0–0.1)
Basophils Relative: 0 % (ref 0–1)
Eosinophils Absolute: 0.1 10*3/uL (ref 0.0–0.7)
Eosinophils Relative: 2 % (ref 0–5)
Lymphocytes Relative: 39 % (ref 12–46)
Lymphs Abs: 2.1 10*3/uL (ref 0.7–4.0)
Monocytes Absolute: 0.4 10*3/uL (ref 0.1–1.0)
Monocytes Relative: 7 % (ref 3–12)

## 2010-08-19 NOTE — Op Note (Signed)
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   OPERATION PERFORMED:  Decompressive lumbar hemilaminectomy, medial  facetectomy and foraminotomy, L4-5 bilaterally for central canal and nerve  root decompression.   SURGEON:  Tia Alert, MD   ANESTHESIA:  General endotracheal.   COMPLICATIONS:  None apparent.   INDICATIONS FOR PROCEDURE:  Katherine [name] is a 60 year old white female who is  referred to the neurosurgical unit with complaints of back pain and  bilateral leg pain.  She had an MRI and a CT myelogram which showed lateral  recess stenosis at L4-5 bilaterally with a synovial cyst on L4-5 on the  right side causing bilateral nerve root compression and decreased  filling  of the L5 nerve root.  She had significant back pain and leg pain which was  not responding to medical management.  I recommended decompressive lumbar  hemilaminectomy and foraminotomy for nerve root decompression.  She  understood the risks and benefits and alternatives and wished to proceed.   DESCRIPTION OF PROCEDURE:  The patient was taken to the operating room and  after induction of adequate general endotracheal anesthesia, she was rolled  into a prone position on the Wilson frame and all pressure points were  padded.  The lumbar region was prepped with DuraPrep and then draped in the  usual sterile fashion.  10mL of local  anesthesia was injected and then a  dorsal midline incision was made and carried down to the lumbosacral fascia.  The fascia was opened and the paraspinous musculature was taken down in a  subperiosteal fashion to expose the L4-5 interspace bilaterally.  Intraoperative x-ray confirmed the level and then I used the Kerrison  punches and high speed air powered Black Max drill to perform  hemilaminectomy, medial facetectomy and foraminotomy at L4-5 on the left  side.  The yellow ligament was opened and removed with a Kerrison punch to  expose the underlying dura and L5 nerve root.  I followed the L5 nerve root  out past the pedicle and dissected out to the medial pedicle wall, performed  a wide foraminotomy.  Once the nerve was decompressed, I inspected the disk  to make sure there was no disk herniation.  I then placed Gelfoam here and  went  to the patient's right side and performed a hemilaminectomy, medial  facetectomy and foraminotomy at L4-5 on the right side again using the  Kerrison punches and high speed drill.  I then again identified the dura and  nerve root by removing the yellow ligament and once again dissected into the  foramen and past the pedicle level where she did have a did have a small  synovial cyst at this level which I was able to remove with a Kerrison  punch. Once the decompression was complete, I palpated with a nerve root to  assure there were no compressive lesions, the nerve roots were free.  I then  irrigated with copious amounts of bacitracin containing saline solution,  dried all bleeding points with bipolar cautery and Gelfoam.  I then removed  the retractor and closed the fascia with interrupted 2-0 Vicryl, closed the  subcutaneous tissue with 2-0 and 3-0 Vicryl  and closed the skin with Dermabond. The drapes were removed.  The patient  was awakened from general anesthesia and transported to the recovery room in  stable condition at the end of the procedure.   All sponge, needle and  instrument counts were correct.                                                Tia Alert, MD    DSJ/MEDQ  D:  01/29/2003  T:  01/30/2003  Job:  [id number]

## 2010-09-06 ENCOUNTER — Telehealth: Payer: Self-pay | Admitting: *Deleted

## 2010-09-06 NOTE — Telephone Encounter (Signed)
Patient says that her eyes have been swollen and chapped x 5 weeks. She want to go to dermatologist. She is asking if you would do referral without seeing her.

## 2010-09-07 ENCOUNTER — Encounter: Payer: Self-pay | Admitting: Internal Medicine

## 2010-09-07 NOTE — Telephone Encounter (Signed)
I am not sure exactly what she means Generally, dermatologists don't require a referral.  I am not that comfortable just referring people without a visit because they are often not needed, or sometimes a different specialist is needed then originally thought

## 2010-09-07 NOTE — Telephone Encounter (Signed)
Spoke with patient and advised results, patient coming in for office visit.

## 2010-09-08 ENCOUNTER — Encounter: Payer: Self-pay | Admitting: Internal Medicine

## 2010-09-08 ENCOUNTER — Ambulatory Visit (INDEPENDENT_AMBULATORY_CARE_PROVIDER_SITE_OTHER): Payer: Medicare Other | Admitting: Internal Medicine

## 2010-09-08 VITALS — BP 100/60 | HR 62 | Temp 98.4°F | Ht 64.5 in | Wt 103.0 lb

## 2010-09-08 DIAGNOSIS — L259 Unspecified contact dermatitis, unspecified cause: Secondary | ICD-10-CM

## 2010-09-08 DIAGNOSIS — L309 Dermatitis, unspecified: Secondary | ICD-10-CM

## 2010-09-08 DIAGNOSIS — R5383 Other fatigue: Secondary | ICD-10-CM

## 2010-09-08 DIAGNOSIS — Z79899 Other long term (current) drug therapy: Secondary | ICD-10-CM

## 2010-09-08 DIAGNOSIS — R5381 Other malaise: Secondary | ICD-10-CM

## 2010-09-08 MED ORDER — TRIAMCINOLONE ACETONIDE 0.025 % EX LOTN: 1.0000 "application " | TOPICAL_LOTION | Freq: Two times a day (BID) | CUTANEOUS | Status: DC

## 2010-09-08 NOTE — Progress Notes (Signed)
Subjective:    Patient ID: Katherine [name], female    DOB: [birth year], 60 y.o.   MRN: [id number]  HPI Has had redness around both eyes --upper lid Looked like it was burned Has been putting cooking oil on it per accupuncturist--this has helped some Started in left eye about 5 weeks ago Now on right eye--but less so  Itchy. Eyes are dry Steroid shot about 3 weeks ago--rash predated it  Has some water coming from eye---slight from right outer canthus  No makeup Has had skin problems from stress and itching---even back when teenager  Current Outpatient Prescriptions on File Prior to Visit  Medication Sig Dispense Refill  . clonazePAM (KLONOPIN) 0.5 MG tablet Take 0.5 mg by mouth daily.        Marland Kitchen HYDROcodone-acetaminophen (LORTAB) 7.5-500 MG per tablet Take 1 tablet by mouth every 6 (six) hours as needed.        . methadone (DOLOPHINE) 10 MG tablet Take 2 by mouth in the morning, take 1 by mouth in the afternoon, take 2 by mouth at bedtime.       . traZODone (DESYREL) 50 MG tablet Take 50-100 mg by mouth at bedtime.        Marland Kitchen DISCONTD: Calcium Carbonate-Vitamin D (CALCIUM-VITAMIN D) 500-200 MG-UNIT per tablet Take 1 tablet by mouth 2 (two) times daily with a meal.        . DISCONTD: polyethylene glycol (MIRALAX / GLYCOLAX) packet Take 17 g by mouth daily.        Marland Kitchen DISCONTD: pyridOXINE (VITAMIN B-6) 50 MG tablet Take 50 mg by mouth daily.        Marland Kitchen DISCONTD: simethicone (MYLICON) 125 MG chewable tablet Chew 125 mg by mouth daily.        Marland Kitchen DISCONTD: vitamin E 400 UNIT capsule Take 400 Units by mouth daily.         Past Medical History  Diagnosis Date  . Chronic back pain     followed by pain clinic  . Scoliosis (and kyphoscoliosis), idiopathic   . Anxiety   . Retention of urine, unspecified   . Alcoholism   . Arthritis   . Depression   . Common bile duct dilatation     Past Surgical History  Procedure Date  . Abdominal hysterectomy   . Appendectomy   . Shoulder open rotator  cuff repair 1994    left shoulder repair  . Breast enhancement surgery 1998  . Back surgery 10/04    Back surgery- lumbar decompression (01/2003)  . 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 Diabetes Mother   . Hypertension Father     History   Social History  . Marital Status: Married    Spouse Name: N/A    Number of Children: N/A  . Years of Education: N/A   Occupational History  . disabled- worked part-time at [employer] at [employer]    Social History Main Topics  . Smoking status: Current Everyday Smoker    Types: Cigarettes  . Smokeless tobacco: Not on file  . Alcohol Use: No  . Drug Use: Not on file  . Sexually Active: Not on file   Other Topics Concern  . Not on file   Social History Narrative  . No narrative on file   Review of Systems No fever No other rash but feels at times like "my  skin is on fire" Accupuncture for rheumatoid arthritis No muscle weakness    Objective:   Physical Exam  Constitutional: She appears well-developed and well-nourished. No distress.  Eyes: Conjunctivae are normal.  Skin:       Dry red rash L>R upper eyelid          Assessment & Plan:

## 2010-09-08 NOTE — Patient Instructions (Signed)
Please use cetaphil after showering or washing face Use the prescription at other times up to three times per day

## 2010-09-08 NOTE — Assessment & Plan Note (Signed)
Redness just on upper eyelids Not violaceous and no muscle symptoms Will try hydrocortisone lotion, cetaphil after shower and washing face Derm if persists or worsens

## 2010-09-08 NOTE — Assessment & Plan Note (Signed)
Chronic issue Will check labs

## 2010-09-09 LAB — VITAMIN B12: Vitamin B-12: 1391 pg/mL — ABNORMAL HIGH (ref 211–911)

## 2010-12-06 ENCOUNTER — Encounter: Payer: Self-pay | Admitting: Internal Medicine

## 2010-12-06 ENCOUNTER — Ambulatory Visit (INDEPENDENT_AMBULATORY_CARE_PROVIDER_SITE_OTHER): Payer: Medicare Other | Admitting: Internal Medicine

## 2010-12-06 VITALS — BP 98/58 | HR 69 | Temp 98.4°F | Ht 64.0 in | Wt 102.0 lb

## 2010-12-06 DIAGNOSIS — M069 Rheumatoid arthritis, unspecified: Secondary | ICD-10-CM | POA: Insufficient documentation

## 2010-12-06 NOTE — Assessment & Plan Note (Signed)
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

## 2010-12-06 NOTE — Progress Notes (Signed)
  Subjective:    Patient ID: Katherine [name], female    DOB: 02-25-1951, 60 y.o.   MRN: [id number]  HPI Here with husband Has been seeing accupuncturist for RA  Especially painful in left CMC  Left thumb DIP pops often Very hard to even use it now  Interested in finding a rheumatologist that uses natural remedies i told her I wasn't sure there is one per se  Has some foods and stress that will bring on her flares   Review of Systems     Objective:   Physical Exam  Constitutional: She appears well-developed and well-nourished. No distress.  Musculoskeletal:       Left thumb DIP pops but is not inflamed Tenderness along CMC on left          Assessment & Plan:

## 2010-12-06 NOTE — Patient Instructions (Signed)
Please set up consultation with Dr Gavin Potters

## 2010-12-22 ENCOUNTER — Encounter: Payer: Self-pay | Admitting: Internal Medicine

## 2010-12-22 ENCOUNTER — Ambulatory Visit (INDEPENDENT_AMBULATORY_CARE_PROVIDER_SITE_OTHER): Payer: Medicare Other | Admitting: Internal Medicine

## 2010-12-22 VITALS — BP 121/66 | HR 79 | Temp 98.5°F | Ht 64.0 in | Wt 102.0 lb

## 2010-12-22 DIAGNOSIS — R509 Fever, unspecified: Secondary | ICD-10-CM

## 2010-12-22 LAB — POCT URINALYSIS DIPSTICK
Blood, UA: NEGATIVE
Glucose, UA: NEGATIVE
Leukocytes, UA: NEGATIVE
Nitrite, UA: NEGATIVE
Urobilinogen, UA: 0.5

## 2010-12-22 NOTE — Progress Notes (Signed)
Subjective:    Patient ID: Katherine [name], female    DOB: May 01, 1950, 60 y.o.   MRN: [id number]  HPI Has appt with rheumatologist in October (Dr Gavin Potters) Still seeing accupuncturist  Has had low grade fever for about 2 weeks Keeps her up at times 100+ degrees Taking lots of ibuprofen---but has messed up her stomach. So she has stopped. This did get the temp down  No cold symptoms Did have some chest pressing sensation last night No cough No skin rash---2 small bumps on stomach in past day  Has had a lot of mosquito bites No tick bites  No dysuria Has to occ strain to pass urine---nothing new  Current Outpatient Prescriptions on File Prior to Visit  Medication Sig Dispense Refill  . clonazePAM (KLONOPIN) 0.5 MG tablet Take 0.5-1 mg by mouth daily.       Marland Kitchen HYDROcodone-acetaminophen (LORTAB) 7.5-500 MG per tablet Take 1 tablet by mouth daily as needed.       . methadone (DOLOPHINE) 10 MG tablet Take 2 by mouth in the morning, take 1 by mouth in the afternoon, take 2 by mouth at bedtime.         Allergies  Allergen Reactions  . Sulfonamide Derivatives     Past Medical History  Diagnosis Date  . Chronic back pain     followed by pain clinic  . Scoliosis (and kyphoscoliosis), idiopathic   . Anxiety   . Retention of urine, unspecified   . Alcoholism   . Arthritis   . Depression   . Common bile duct dilatation     Past Surgical History  Procedure Date  . Abdominal hysterectomy   . Appendectomy   . Shoulder open rotator cuff repair 1994    left shoulder repair  . Breast enhancement surgery 1998  . Back surgery 10/04    Back surgery- lumbar decompression (01/2003)  . 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 Diabetes Mother   . Hypertension Father     History   Social History  . Marital Status: Married    Spouse Name: N/A    Number of Children: N/A  . Years  of Education: N/A   Occupational History  . disabled- worked part-time at [employer] at [employer]    Social History Main Topics  . Smoking status: Current Everyday Smoker    Types: Cigarettes  . Smokeless tobacco: Never Used  . Alcohol Use: No  . Drug Use: Not on file  . Sexually Active: Not on file   Other Topics Concern  . Not on file   Social History Narrative  . No narrative on file   Review of Systems No vomiting or diarrhea Constipated from the ibuprofen    Objective:   Physical Exam  Constitutional: She appears well-developed and well-nourished. No distress.  HENT:  Mouth/Throat: Oropharynx is clear and moist. No oropharyngeal exudate.       TMs normal No nasal inflammation  Neck: Normal range of motion. Neck supple.  Cardiovascular: Normal rate and regular rhythm.  Exam reveals no gallop.   No murmur heard. Pulmonary/Chest: Effort normal and breath sounds normal. No respiratory distress. She has no wheezes. She has no rales.  Abdominal: Soft. There is no tenderness.  Musculoskeletal: She exhibits no edema and no tenderness.       Joints in hands fairly quiet now  Lymphadenopathy:  She has no cervical adenopathy.  Skin:       Single pair of papules which are touching along left flank  Psychiatric: Her behavior is normal. Judgment and thought content normal.          Assessment & Plan:

## 2010-12-22 NOTE — Patient Instructions (Signed)
Please try acetaminophen up to 650mg  three times a day for the fever---if needed Please keep the rheumatology appointment

## 2010-12-22 NOTE — Assessment & Plan Note (Signed)
Has low grade fevers for about 2 weeks No history of TB in the past No heart murmur, sig rash, tick exposure Symptoms not consistent with WNV infection  Urinalysis is normal  Discussed that this could be related to mixed connective tissue disease if she has this Recommended tylenol Keep appt with rheumatologist

## 2011-01-02 IMAGING — RF DG ERCP WO/W SPHINCTEROTOMY
5 series · 5 of 5 positions shown · non-contrast
Comparison: None

CLINICAL DATA: Common bile duct stones and biliary dilatation.
Abdominal pain.

ERCP
Fluoroscopy time:  2.0 minutes.
TECHNIQUE: Multiple spot images obtained with the fluoroscopic
device and submitted for interpretation post-procedure.  ERCP
performed by Dr. Midori; please see their report for the amount of
fluoroscopic time utilized during the procedure.

[Series 3: cont. · 1 of 1 slices shown (1 of 5)]
[im 1/1]
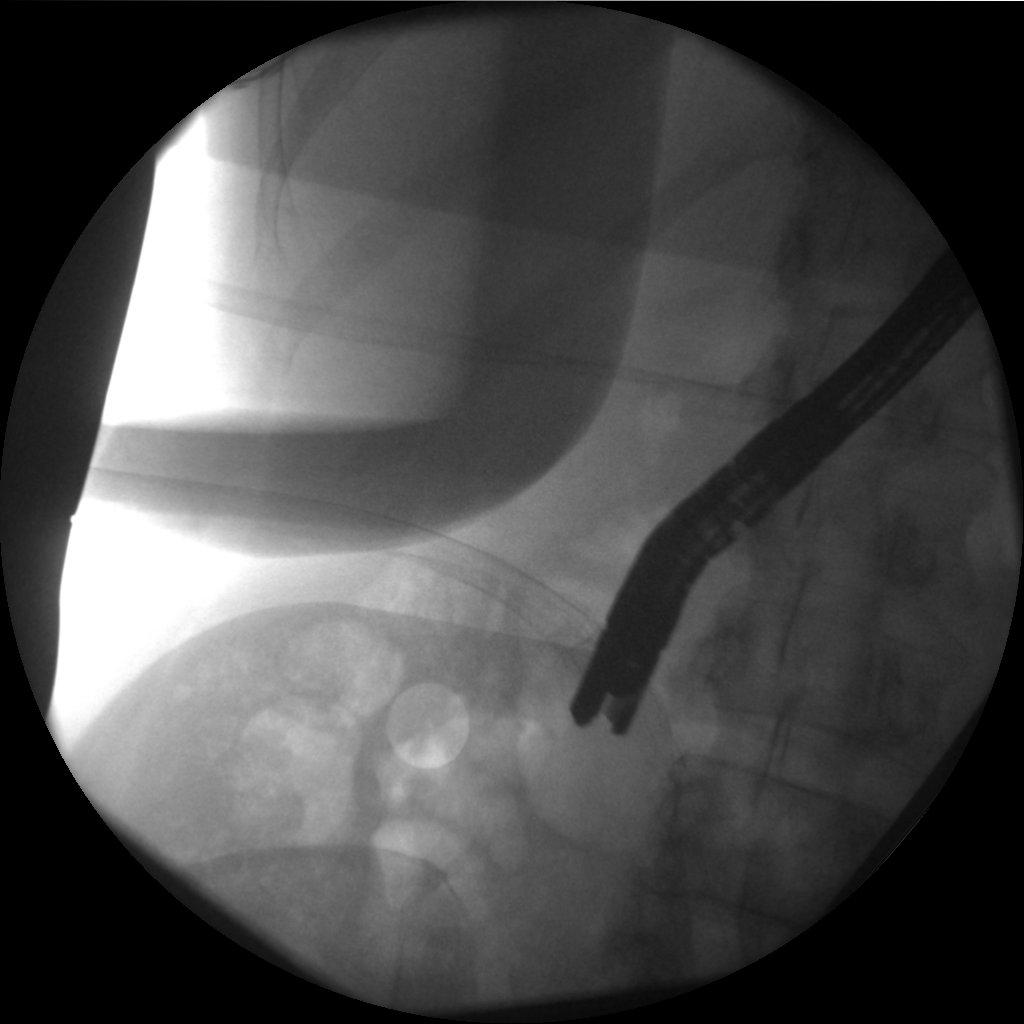

[Series 5: cont. · 1 of 1 slices shown (2 of 5)]
[im 1/1]
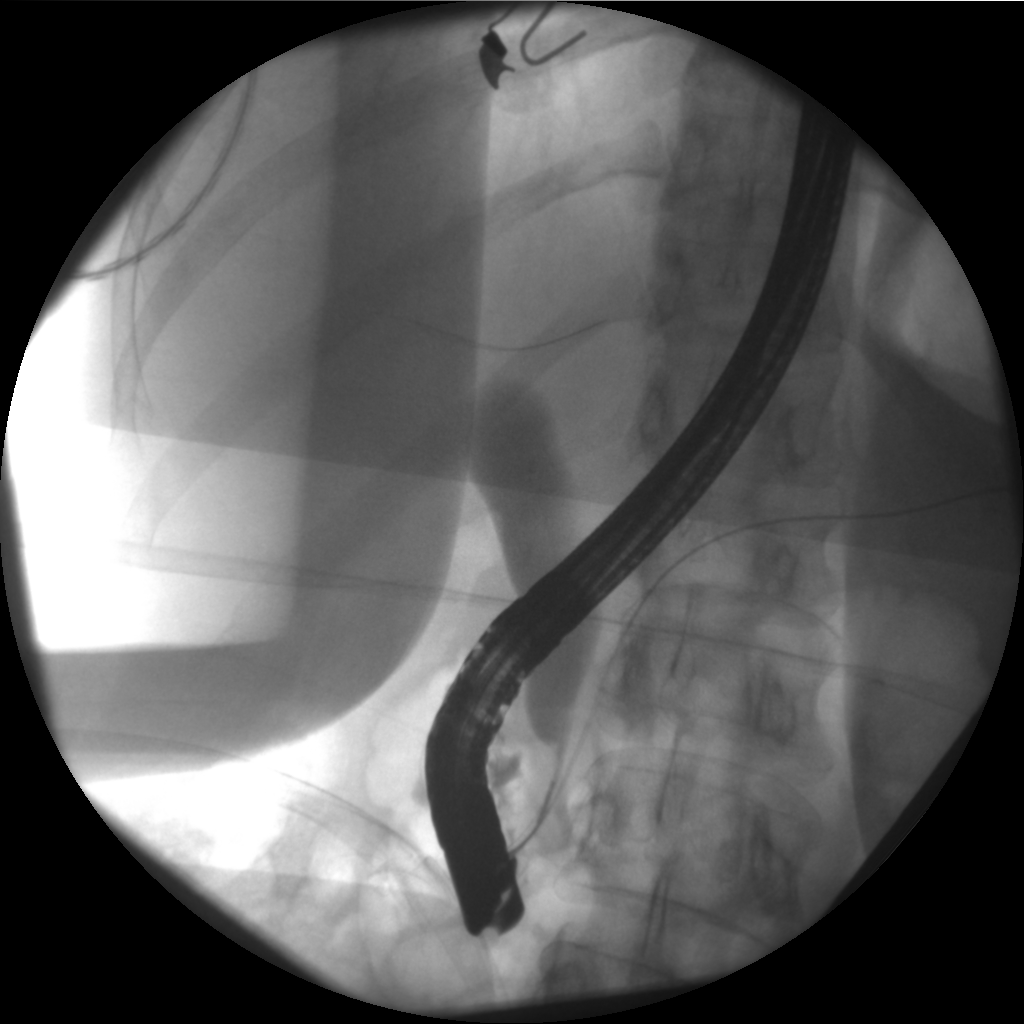

[Series 6: cont. · 1 of 1 slices shown (3 of 5)]
[im 1/1]
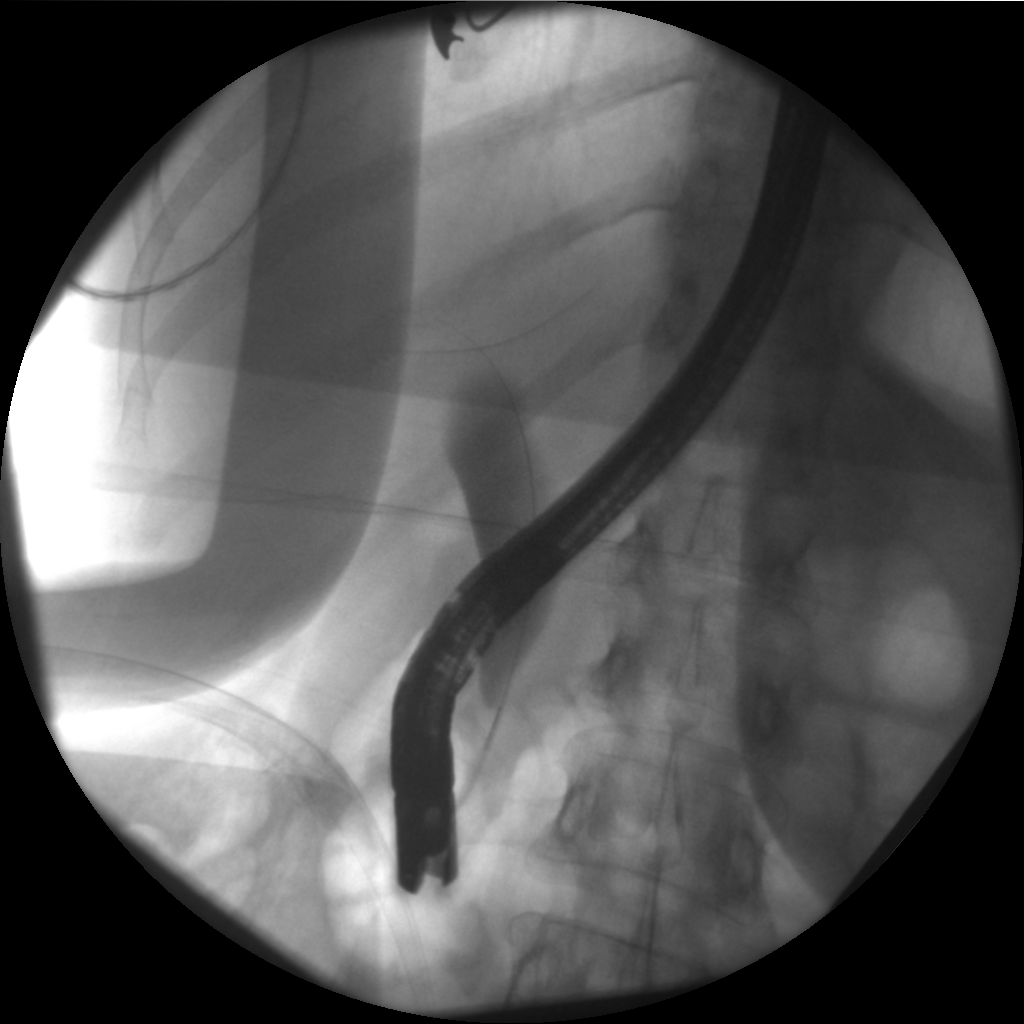

[Series 7: cont. · 1 of 1 slices shown (4 of 5)]
[im 1/1]
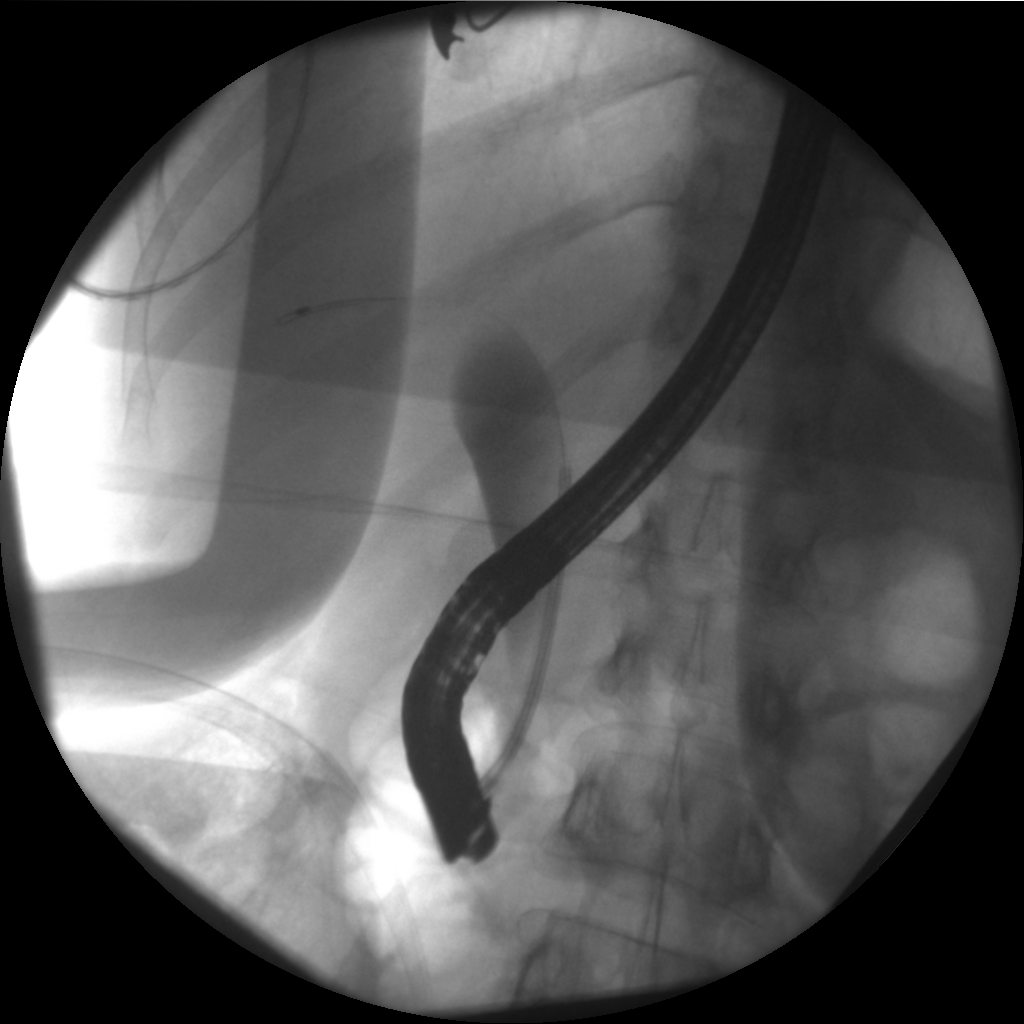

[Series 9: cont. · 1 of 1 slices shown (5 of 5)]
[im 1/1]
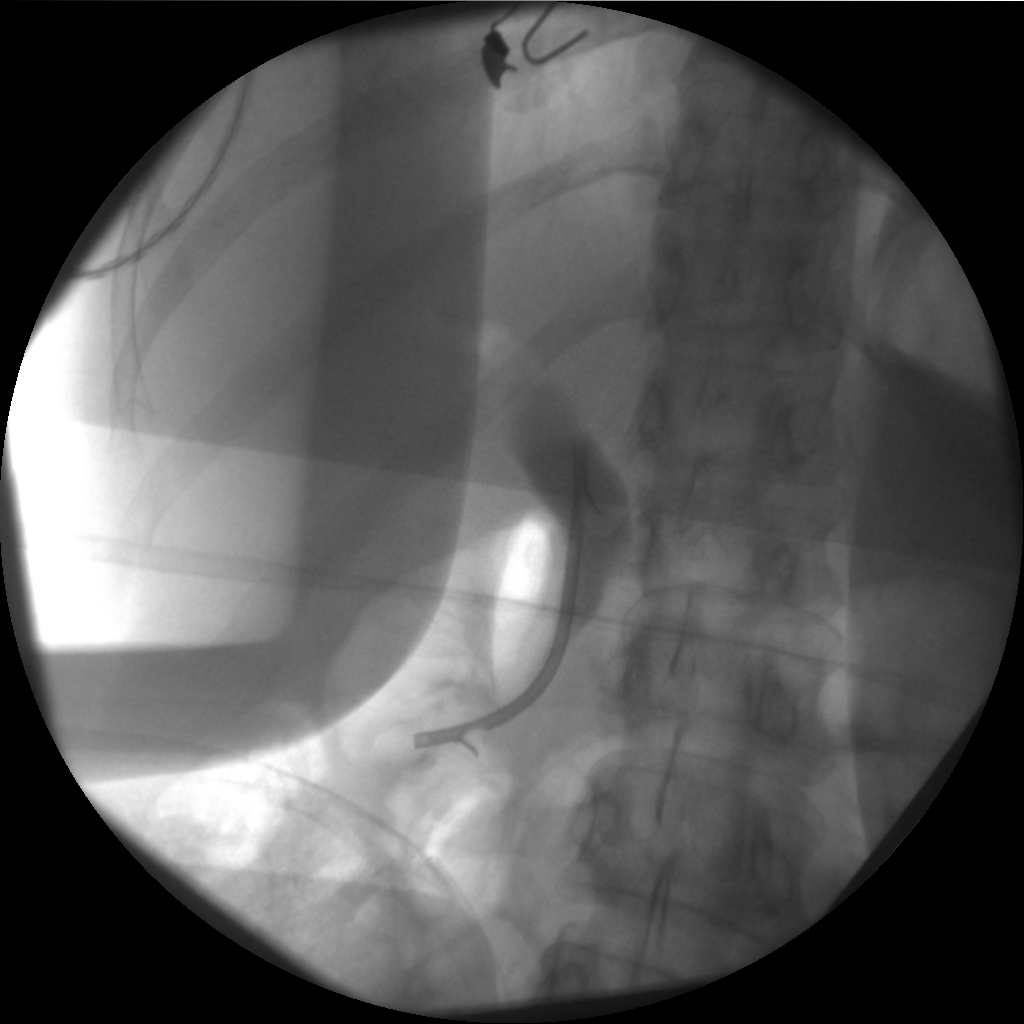

[5 of 5 positions shown; findings below may reference images not displayed]

FINDINGS: Initial fluoroscopic spot images show cannulation of the
common bile duct, and placement of a guide wire within the
pancreatic duct. Contrast injection shows a moderately dilated
common bile duct with stricture of the distal common bile duct.
Intrahepatic bile ducts are not opacified.

An internal biliary stent was placed across the distal common bile
duct stricture.
IMPRESSION: 1.  Distal common bile duct stricture, with moderate biliary
dilatation proximally.
2.  Placement of internal biliary stent in appropriate position.

## 2011-01-11 ENCOUNTER — Ambulatory Visit: Payer: Medicare Other | Admitting: Internal Medicine

## 2011-01-13 ENCOUNTER — Ambulatory Visit: Payer: Medicare Other | Admitting: Internal Medicine

## 2011-01-16 ENCOUNTER — Other Ambulatory Visit: Payer: Self-pay | Admitting: *Deleted

## 2011-01-16 MED ORDER — POLYETHYLENE GLYCOL 3350 17 GM/SCOOP PO POWD: 17.0000 g | Freq: Every day | ORAL | Status: DC

## 2011-01-16 NOTE — Telephone Encounter (Signed)
Phoned request from pt, she would like a months worth with several [facility] [place].  She is having a lot of financial difficulty and is unable to come in for visit at this time.  She wants to continue with the prescription because otc miralax is too costly for her.

## 2011-01-16 NOTE — Telephone Encounter (Signed)
Okay to send 1 month with prn refills

## 2011-01-16 NOTE — Telephone Encounter (Signed)
rx sent to pharmacy by e-script  

## 2011-02-07 ENCOUNTER — Other Ambulatory Visit: Payer: Self-pay | Admitting: *Deleted

## 2011-02-07 MED ORDER — POLYETHYLENE GLYCOL 3350 17 GM/SCOOP PO POWD: 17.0000 g | Freq: Every day | ORAL | Status: DC

## 2011-06-09 ENCOUNTER — Ambulatory Visit: Payer: Medicare Other | Admitting: Internal Medicine

## 2011-06-09 DIAGNOSIS — Z0289 Encounter for other administrative examinations: Secondary | ICD-10-CM

## 2011-07-17 DIAGNOSIS — G8929 Other chronic pain: Secondary | ICD-10-CM | POA: Insufficient documentation

## 2011-07-17 DIAGNOSIS — F1190 Opioid use, unspecified, uncomplicated: Secondary | ICD-10-CM | POA: Insufficient documentation

## 2011-08-14 ENCOUNTER — Emergency Department: Payer: Self-pay | Admitting: Emergency Medicine

## 2011-08-15 ENCOUNTER — Encounter: Payer: Self-pay | Admitting: Internal Medicine

## 2011-08-15 ENCOUNTER — Telehealth: Payer: Self-pay

## 2011-08-15 ENCOUNTER — Ambulatory Visit (INDEPENDENT_AMBULATORY_CARE_PROVIDER_SITE_OTHER): Payer: Medicare Other | Admitting: Internal Medicine

## 2011-08-15 VITALS — BP 100/60 | HR 79 | Temp 98.5°F | Wt 106.0 lb

## 2011-08-15 DIAGNOSIS — R509 Fever, unspecified: Secondary | ICD-10-CM

## 2011-08-15 LAB — POCT URINALYSIS DIPSTICK
Protein, UA: NEGATIVE
Spec Grav, UA: <=1.005
Urobilinogen, UA: NEGATIVE

## 2011-08-15 NOTE — Telephone Encounter (Signed)
See office evaluation

## 2011-08-15 NOTE — Telephone Encounter (Signed)
French Ana with [facility] request appt for pt with fever and reddened thumb. Dr Alphonsus Sias will see today at 12:30pm. French Ana will notify pt.

## 2011-08-15 NOTE — Assessment & Plan Note (Signed)
She has had fever for 5 days Documented 104.8 at surgeon's office yesterday--per patient Looks okay now and not febrile Urinalysis negative No skin or respiratory source No heart murmur  No evidence of bacterial infection Discussed FUO after 2 weeks Would be most concerned about recurrence of RA  Will watch for now If fever persists, will need to consider further work up  She tells of severe mental problems with husband (who is better now though--and with her initially) Emotional stress ---discussed that this wouldn't cause the fever

## 2011-08-15 NOTE — Patient Instructions (Signed)
Call for a follow up appt next week if the fever persists

## 2011-08-15 NOTE — Progress Notes (Signed)
Subjective:    Patient ID: Katherine [name], female    DOB: 1950/09/15, 61 y.o.   MRN: [id number]  HPI Has been running fever for 5 days As high as 104.8 yesterday  Had surgery for trigger finger in both thumbs by Dr Erin Sons 2 weeks ago Stitches out Friday  Had some redness and swelling He didn't feel there was infection though Tried cleaning with peroxide---right side fizzled and opened up Saw him again yesterday---still didn't think thumb was infected Sent to ER due to the 104+ fever but she eventually left without being seen  No cold symptoms No cough, rhinorrhea, SOB No dysuria, urgency or increased frequency Gets rattling chills before the fever comes on Taking tylenol to keep fever down  Current Outpatient Prescriptions on File Prior to Visit  Medication Sig Dispense Refill  . clonazePAM (KLONOPIN) 0.5 MG tablet Take 0.5-1 mg by mouth daily.       . methadone (DOLOPHINE) 10 MG tablet Take 40 mg by mouth daily.       . polyethylene glycol powder (GLYCOLAX/MIRALAX) powder Take 17 g by mouth daily.  520 g  prn    Allergies  Allergen Reactions  . Sulfonamide Derivatives     Past Medical History  Diagnosis Date  . Chronic back pain     followed by pain clinic  . Scoliosis (and kyphoscoliosis), idiopathic   . Anxiety   . Retention of urine, unspecified   . Alcoholism   . Arthritis   . Depression   . Common bile duct dilatation     Past Surgical History  Procedure Date  . Abdominal hysterectomy   . Appendectomy   . Shoulder open rotator cuff repair 1994    left shoulder repair  . Breast enhancement surgery 1998  . Back surgery 10/04    Back surgery- lumbar decompression (01/2003)  . 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 Diabetes Mother   . Hypertension Father     History   Social History  . Marital Status: Married    Spouse Name: N/A   Number of Children: N/A  . Years of Education: N/A   Occupational History  . disabled- worked part-time at [employer] at [employer]    Social History Main Topics  . Smoking status: Former Smoker    Types: Cigarettes    Quit date: 06/21/2010  . Smokeless tobacco: Never Used  . Alcohol Use: No  . Drug Use: Not on file  . Sexually Active: Not on file   Other Topics Concern  . Not on file   Social History Narrative  . No narrative on file   Review of Systems No vomiting or diarrhea Appetite is off some No rash or skin lesions RA seems resolved since got 1.5 years of accupuncture Off a lot of the narcotics Has been under an enormous amount of stress    Objective:   Physical Exam  Constitutional: She appears well-developed and well-nourished. No distress.  HENT:  Mouth/Throat: Oropharynx is clear and moist. No oropharyngeal exudate.       TMs fine  Neck: Normal range of motion. Neck supple.  Cardiovascular: Normal rate, regular rhythm and normal heart sounds.  Exam reveals no gallop.   No murmur heard. Pulmonary/Chest: Effort normal and breath sounds normal. No respiratory distress. She has no wheezes. She has no rales.  Abdominal: Soft. There  is no tenderness.  Musculoskeletal: She exhibits no edema and no tenderness.       No joint swelling  Lymphadenopathy:    She has no cervical adenopathy.    She has no axillary adenopathy.       Right: No inguinal adenopathy present.       Left: No inguinal adenopathy present.  Skin: No rash noted. No erythema.       Surgical sites on thumbs are not infected          Assessment & Plan:

## 2011-08-15 NOTE — Telephone Encounter (Signed)
Caller: Katherine [name]/Patient; PCP: Tillman Abide I.; CB#: [phone]; ; ; Call regarding Fever;  Pt had trigger therapy (sheath cut and repaired) in both thumbs on 07/31/11 at [facility] orthopaedics. Pt returned to get the stitches out on 08/11/11 - pt states the one thumb appeared reddended which she pointed out to the office/they did not seem concerned. She began to run a temp that day of 102 - 103 with no other sx. Pt continued to run a fever over the w/e . Yesterday the pt was at [facility] orthopaedics because of the fever - her temp was 104.8. the office sent her to [facility] ED where she waited so long to be evaluated that she left. Pt has continued to run a fever overnight and it is 101 now. She takes Tylenol q3h. RN triaged and spoke with office nurse who consulted Dr. Alphonsus Sias to see if pt can be assessed at the office. He advised pt can be seen here at 12:30 today. Pt notified.

## 2012-01-12 DIAGNOSIS — M5416 Radiculopathy, lumbar region: Secondary | ICD-10-CM | POA: Insufficient documentation

## 2012-01-12 DIAGNOSIS — M5417 Radiculopathy, lumbosacral region: Secondary | ICD-10-CM | POA: Insufficient documentation

## 2012-02-22 ENCOUNTER — Other Ambulatory Visit: Payer: Self-pay | Admitting: Internal Medicine

## 2012-02-27 ENCOUNTER — Telehealth: Payer: Self-pay

## 2012-02-27 ENCOUNTER — Ambulatory Visit: Payer: Medicare Other

## 2012-02-27 NOTE — Telephone Encounter (Signed)
Pt left vm about status of refill for laxative to [facility]. Forest Becker at front desk has already advised pt refill sent on 02/22/12 and was picked up on 02/25/12.

## 2012-02-28 ENCOUNTER — Other Ambulatory Visit: Payer: Self-pay | Admitting: *Deleted

## 2012-02-28 MED ORDER — ZOSTER VACCINE LIVE 19400 UNT/0.65ML ~~LOC~~ SOLR: 0.6500 mL | Freq: Once | SUBCUTANEOUS | Status: DC

## 2012-02-28 NOTE — Telephone Encounter (Signed)
Per patient would like [facility], her insurance will only pay [facility] done at [facility]. rx sent to pharmacy by e-script

## 2012-04-19 ENCOUNTER — Encounter: Payer: Medicare Other | Admitting: Internal Medicine

## 2012-04-29 ENCOUNTER — Encounter: Payer: Self-pay | Admitting: Internal Medicine

## 2012-06-28 ENCOUNTER — Ambulatory Visit (INDEPENDENT_AMBULATORY_CARE_PROVIDER_SITE_OTHER): Payer: Medicare Other | Admitting: Internal Medicine

## 2012-06-28 ENCOUNTER — Encounter: Payer: Self-pay | Admitting: Internal Medicine

## 2012-06-28 VITALS — BP 118/62 | HR 66 | Temp 98.3°F | Ht 65.0 in | Wt 107.0 lb

## 2012-06-28 DIAGNOSIS — F411 Generalized anxiety disorder: Secondary | ICD-10-CM

## 2012-06-28 DIAGNOSIS — R0602 Shortness of breath: Secondary | ICD-10-CM

## 2012-06-28 DIAGNOSIS — Z23 Encounter for immunization: Secondary | ICD-10-CM

## 2012-06-28 DIAGNOSIS — Z0000 Encounter for general adult medical examination without abnormal findings: Secondary | ICD-10-CM

## 2012-06-28 LAB — LIPID PANEL
HDL: 78 mg/dL (ref >39)
LDL Cholesterol: 99 mg/dL (ref 0–99)
Total CHOL/HDL Ratio: 2.5 Ratio
VLDL: 19 mg/dL (ref 0–40)

## 2012-06-28 LAB — CBC WITH DIFFERENTIAL/PLATELET
Basophils Absolute: 0 10*3/uL (ref 0.0–0.1)
HCT: 36.9 % (ref 36.0–46.0)
Hemoglobin: 12.8 g/dL (ref 12.0–15.0)
Lymphocytes Relative: 49 % — ABNORMAL HIGH (ref 12–46)
Monocytes Absolute: 0.4 10*3/uL (ref 0.1–1.0)
Neutro Abs: 2 10*3/uL (ref 1.7–7.7)
RDW: 13.9 % (ref 11.5–15.5)
WBC: 5.1 10*3/uL (ref 4.0–10.5)

## 2012-06-28 LAB — BASIC METABOLIC PANEL
Calcium: 9.6 mg/dL (ref 8.4–10.5)
Glucose, Bld: 74 mg/dL (ref 70–99)
Sodium: 141 mEq/L (ref 135–145)

## 2012-06-28 LAB — HEPATIC FUNCTION PANEL
AST: 25 U/L (ref 0–37)
Bilirubin, Direct: 0.1 mg/dL (ref 0.0–0.3)
Total Bilirubin: 0.3 mg/dL (ref 0.3–1.2)

## 2012-06-28 NOTE — Patient Instructions (Signed)
Please call to set up your screening mammogram. 

## 2012-06-28 NOTE — Progress Notes (Signed)
Subjective:    Patient ID: Katherine [name], female    DOB: 11-03-1950, 62 y.o.   MRN: [id number]  HPI Here for a physical  Reviewed rheumatolgic history Saw Dr Verner Mould couldn't confirm active RA She now feels she has Sjogren's---dry mouth and dry eyes------ never confirmed with serology though Did acupuncture for a year--hands better Still some feet pain --not RA though  Ongoing care at pain clinic Satisfied at this point---has cut back (stopped hydrocodone except breakthrough) and cut down methadone Tries to walk regularly  Chronic depression Feels the B12 is for that Intolerant of any antidepressants Stress with husband's health---severe hypoglycemia, depression  Current Outpatient Prescriptions on File Prior to Visit  Medication Sig Dispense Refill  . clonazePAM (KLONOPIN) 0.5 MG tablet Take 0.5-1 mg by mouth daily.       . methadone (DOLOPHINE) 10 MG tablet Take 40 mg by mouth daily.       . polyethylene glycol powder (GLYCOLAX/MIRALAX) powder TAKE 17 G BY MOUTH DAILY.  527 g  10   No current facility-administered medications on file prior to visit.    Allergies  Allergen Reactions  . Sulfonamide Derivatives     Past Medical History  Diagnosis Date  . Chronic back pain     followed by pain clinic  . Scoliosis (and kyphoscoliosis), idiopathic   . Anxiety   . Retention of urine, unspecified   . Alcoholism   . Arthritis   . Depression   . Common bile duct dilatation     Past Surgical History  Procedure Laterality Date  . Abdominal hysterectomy    . Appendectomy    . Shoulder open rotator cuff repair  1994    left shoulder repair  . Breast enhancement surgery  1998  . Back surgery  10/04    Back surgery- lumbar decompression (01/2003)  . 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 Diabetes Mother   . Hypertension Father     History   Social  History  . Marital Status: Married    Spouse Name: N/A    Number of Children: N/A  . Years of Education: N/A   Occupational History  . disabled- worked part-time at [employer] at [employer]    Social History Main Topics  . Smoking status: Former Smoker    Types: Cigarettes    Quit date: 06/21/2010  . Smokeless tobacco: Never Used  . Alcohol Use: No  . Drug Use: Not on file  . Sexually Active: Not on file   Other Topics Concern  . Not on file   Social History Narrative  . No narrative on file   Review of Systems  Constitutional: Positive for fatigue. Negative for unexpected weight change.       Still sees the chiropractor still Wears seat belt  HENT: Positive for dental problem. Negative for hearing loss and tinnitus.        Trouble with teeth due to dry mouth  Eyes: Negative for visual disturbance.       Dry eye  Respiratory: Positive for shortness of breath. Negative for cough and chest tightness.        Has to gasp in breath---relates to anxiety  Cardiovascular: Negative for chest pain, palpitations and leg swelling.  Gastrointestinal: Positive for constipation. Negative for nausea, vomiting, abdominal pain and blood in stool.  Appetite is poor---relates to meds No heartburn Rare constipation and will require disimpaction  Endocrine: Positive for cold intolerance. Negative for heat intolerance.       Cold weather is bad for her back  Genitourinary: Positive for difficulty urinating. Negative for dysuria.       Trouble voiding at night---relates to her meds  Musculoskeletal: Positive for back pain and arthralgias. Negative for joint swelling.  Allergic/Immunologic: Negative for environmental allergies and immunocompromised state.  Neurological: Positive for weakness and headaches. Negative for dizziness, syncope and light-headedness.       Leg weakness  Hematological: Negative for adenopathy. Does not bruise/bleed easily.  Psychiatric/Behavioral: Positive for sleep disturbance  and dysphoric mood. The patient is nervous/anxious.        Chronic sleep problems Stress, mood issues, fatigue       Objective:   Physical Exam  Constitutional: She is oriented to person, place, and time. She appears well-developed and well-nourished. No distress.  HENT:  Head: Normocephalic and atraumatic.  Right Ear: External ear normal.  Left Ear: External ear normal.  Mouth/Throat: Oropharynx is clear and moist. No oropharyngeal exudate.  Eyes: Conjunctivae and EOM are normal. Pupils are equal, round, and reactive to light.  Neck: Normal range of motion. Neck supple. No thyromegaly present.  Cardiovascular: Normal rate, regular rhythm, normal heart sounds and intact distal pulses.  Exam reveals no gallop.   No murmur heard. Pulmonary/Chest: Effort normal and breath sounds normal. No respiratory distress. She has no wheezes. She has no rales.  Abdominal: Soft. There is no tenderness.  Genitourinary:  Breast implants intact No masses  Musculoskeletal: She exhibits no edema and no tenderness.  Lymphadenopathy:    She has no cervical adenopathy.    She has no axillary adenopathy.  Neurological: She is alert and oriented to person, place, and time.  Skin: No rash noted. No erythema.  Psychiatric: Her behavior is normal.  Mildly anxious Appropriate interaction Doesn't appear depressed          Assessment & Plan:

## 2012-06-28 NOTE — Addendum Note (Signed)
Addended by: Sueanne Margarita on: 06/28/2012 02:56 PM   Modules accepted: Orders

## 2012-06-28 NOTE — Assessment & Plan Note (Signed)
Ongoing Stress with dependent husband (who had issues before anyway) Chronic depression also I think--resistant to and unable to take meds in past

## 2012-06-28 NOTE — Assessment & Plan Note (Signed)
Lots of stress Due for mammo---not sure she wants Will update with Tdap

## 2012-06-28 NOTE — Addendum Note (Signed)
Addended by: Chandra Batch E on: 06/28/2012 02:50 PM   Modules accepted: Orders

## 2012-06-28 NOTE — Assessment & Plan Note (Signed)
Sounds like an anxiety thing Will just check labs

## 2012-07-01 ENCOUNTER — Encounter: Payer: Self-pay | Admitting: *Deleted

## 2012-07-02 ENCOUNTER — Encounter: Payer: Self-pay | Admitting: Internal Medicine

## 2012-07-15 ENCOUNTER — Telehealth: Payer: Self-pay | Admitting: *Deleted

## 2012-07-15 NOTE — Telephone Encounter (Signed)
Patient needs a referral  For a mammogram sent to [facility]  Fax number: [phone].

## 2012-07-15 NOTE — Telephone Encounter (Signed)
Spoke with patient's husband and he states [facility] won't let pt setup her own mammo.   Shirlee Limerick, what do I need to do?

## 2012-07-15 NOTE — Telephone Encounter (Signed)
Please let her know that she can now set up her own screening mammogram

## 2012-07-16 ENCOUNTER — Other Ambulatory Visit: Payer: Self-pay | Admitting: Internal Medicine

## 2012-07-16 DIAGNOSIS — Z1231 Encounter for screening mammogram for malignant neoplasm of breast: Secondary | ICD-10-CM

## 2012-07-16 NOTE — Telephone Encounter (Signed)
Screening MMG order with implants faxed to [facility] for appt 07/23/12 at 11:30 , Copy of order also mailed  To patient.

## 2012-07-24 ENCOUNTER — Encounter: Payer: Self-pay | Admitting: Internal Medicine

## 2013-01-16 ENCOUNTER — Telehealth: Payer: Self-pay

## 2013-01-16 NOTE — Telephone Encounter (Signed)
Pt left v/m; pt received letter from our office advising pt to schedule mammogram. Pt had mammogram at [facility] on 07/23/12. Pt request cb when her chart is updated.

## 2013-01-17 NOTE — Telephone Encounter (Signed)
Spoke with patient and advised we already have her results, from April and her chart was already updated, I also advised that we didn't send a letter and maybe that was a mistake

## 2013-01-21 ENCOUNTER — Other Ambulatory Visit: Payer: Self-pay | Admitting: Internal Medicine

## 2013-02-06 ENCOUNTER — Other Ambulatory Visit: Payer: Self-pay

## 2013-04-24 ENCOUNTER — Telehealth: Payer: Self-pay

## 2013-04-24 NOTE — Telephone Encounter (Signed)
Pt request copy of how much money was spent 2014 with [employer] for tax purposes; advised pt to contact [facility] billing at [phone].

## 2013-10-28 ENCOUNTER — Telehealth: Payer: Self-pay | Admitting: Internal Medicine

## 2013-10-28 NOTE — Telephone Encounter (Signed)
Per the note below, Date of Onset of Symptoms: 07/16/2013.  If no emergent sx, then okay to wait for PCP eval later this week. If she doesn't want to come in sooner or go to ER, and that has been offered, we'll have to defer to her.

## 2013-10-28 NOTE — Telephone Encounter (Signed)
Patient Information:  Caller Name: [place]on  Phone: [phone]  Patient: Katherine [name], Katherine [name]  Gender: Female  DOB: [birth year]  Age: 64 Years  PCP: Viviana Simpler [facility])  Office Follow Up:  Does the office need to follow up with this patient?: No  Instructions For The Office: N/A  RN Note:  Offered appointment today or tomorrow, but patient refused. She can come into the office on Thursday or Friday. Please contact patient to let her know if she can get an appointment scheduled.  Symptoms  Reason For Call & Symptoms: Anxiety/panic with chest tightness. Has recently moved homes in the last 2.5 months. Stress regarding husband in the household having dementia.  Reviewed Health History In EMR: Yes  Reviewed Medications In EMR: Yes  Reviewed Allergies In EMR: Yes  Reviewed Surgeries / Procedures: Yes  Date of Onset of Symptoms: 07/16/2013  Treatments Tried: Klonopin  Treatments Tried Worked: No  Guideline(s) Used:  Chest Pain  Disposition Per Guideline:   Go to ED Now (or to Office with PCP Approval)  Reason For Disposition Reached:   Chest pain lasting longer than 5 minutes  Advice Given:  N/A  Patient Refused Recommendation:  Patient Refused Care Advice  Wants an appointment for Thursday or Friday.

## 2013-10-29 NOTE — Telephone Encounter (Signed)
Pt coming in tomorrow

## 2013-10-30 ENCOUNTER — Ambulatory Visit (INDEPENDENT_AMBULATORY_CARE_PROVIDER_SITE_OTHER): Payer: Medicare Other | Admitting: Internal Medicine

## 2013-10-30 ENCOUNTER — Encounter: Payer: Self-pay | Admitting: Internal Medicine

## 2013-10-30 VITALS — BP 94/56 | HR 72 | Temp 98.6°F | Wt 102.5 lb

## 2013-10-30 DIAGNOSIS — R002 Palpitations: Secondary | ICD-10-CM

## 2013-10-30 NOTE — Progress Notes (Signed)
Pre visit review using our clinic review tool, if applicable. No additional management support is needed unless otherwise documented below in the visit note. 

## 2013-10-30 NOTE — Assessment & Plan Note (Addendum)
Unlikely to be arhthymia since it always occurs with panic when her husband goes "crazy" She does describe very fast heartbeat--but I still doubt SVT EKG is normal BP precludes trial with beta blocker Will just check labs Needs to bring her husband back to his psychiatrist (and she may need change in Rx) Given her narcotics--I can't give benzos

## 2013-10-30 NOTE — Progress Notes (Signed)
Subjective:    Patient ID: Katherine [name], female    DOB: Jul 06, 1950, 63 y.o.   MRN: [id number]  HPI BP was 150/90 a couple of days ago Ongoing acupuncture  Having bad panic attacks-- can't breathe, chest tightens up Acupuncture may be helping some Gets palpitations where it feels like heart is racing  [employer] in [place], bought place here Still has dad back there Stress with husband's serious health issues Very upset with his condition--- he goes "out of his head"  Sees Dr Rosine Door--- on clonazepam Continues on the clonazepam--tries to limit it (not happy with it) Husband's spells will provoke her panic  Current Outpatient Prescriptions on File Prior to Visit  Medication Sig Dispense Refill  . clonazePAM (KLONOPIN) 0.5 MG tablet Take 0.5-1 mg by mouth 2 (two) times daily as needed.       . cyanocobalamin (,VITAMIN B-12,) 1000 MCG/ML injection Inject 1,000 mcg into the muscle every 30 (thirty) days.      Marland Kitchen HYDROcodone-acetaminophen (NORCO/VICODIN) 5-325 MG per tablet Take 1 tablet by mouth as needed for pain.      . methadone (DOLOPHINE) 10 MG tablet Take 40 mg by mouth daily.       . methadone (DOLOPHINE) 5 MG tablet Take 5 mg by mouth as needed for pain.      . polyethylene glycol powder (GLYCOLAX/MIRALAX) powder MIX 17 GRAMS IN LIQUID AND DRINK DAILY.  527 g  10   No current facility-administered medications on file prior to visit.    Allergies  Allergen Reactions  . Sulfonamide Derivatives     Past Medical History  Diagnosis Date  . Chronic back pain     followed by pain clinic  . Scoliosis (and kyphoscoliosis), idiopathic   . Anxiety   . Retention of urine, unspecified   . Alcoholism   . Arthritis   . Depression   . Common bile duct dilatation     Past Surgical History  Procedure Laterality Date  . Abdominal hysterectomy    . Appendectomy    . Shoulder open rotator cuff repair  1994    left shoulder repair  . Breast enhancement surgery  1998  .  Back surgery  10/04    Back surgery- lumbar decompression (01/2003)  . 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 Diabetes Mother   . Hypertension Father     History   Social History  . Marital Status: Married    Spouse Name: N/A    Number of Children: N/A  . Years of Education: N/A   Occupational History  . disabled- worked part-time at [employer] at [place]  . Smoking status: Former Smoker    Types: Cigarettes    Quit date: 06/21/2010  . Smokeless tobacco: Never Used  . Alcohol Use: No  . Drug Use: Not on file  . Sexual Activity: Not on file   Other Topics Concern  . Not on file   Social History Narrative  . No narrative on file   Review of Systems Not sleeping well Eats fairly well--but can't gain weight    Objective:   Physical Exam  Constitutional: She appears well-developed and well-nourished. No distress.  Neck: Normal range of motion. Neck supple. No thyromegaly present.  Cardiovascular: Normal rate, regular rhythm and normal heart sounds.  Exam reveals  no gallop.   No murmur heard. Pulmonary/Chest: Effort normal and breath sounds normal. No respiratory distress. She has no wheezes. She has no rales.  Abdominal: Soft. There is no tenderness.  Musculoskeletal: She exhibits no edema and no tenderness.  Lymphadenopathy:    She has no cervical adenopathy.  Psychiatric:  Tearful discussing husband's issues---states he is manic, delusional, etc Very anxious but appropriate interaction          Assessment & Plan:

## 2013-10-31 LAB — COMPREHENSIVE METABOLIC PANEL
ALT: 23 U/L (ref 0–35)
AST: 27 U/L (ref 0–37)
Albumin: 4.2 g/dL (ref 3.5–5.2)
Alkaline Phosphatase: 74 U/L (ref 39–117)
BUN: 14 mg/dL (ref 6–23)
CALCIUM: 9.3 mg/dL (ref 8.4–10.5)
CHLORIDE: 105 meq/L (ref 96–112)
CO2: 30 meq/L (ref 19–32)
CREATININE: 0.8 mg/dL (ref 0.4–1.2)
GFR: 74.07 mL/min (ref >60.00)
GLUCOSE: 79 mg/dL (ref 70–99)
Potassium: 4.3 mEq/L (ref 3.5–5.1)
Sodium: 140 mEq/L (ref 135–145)
Total Bilirubin: 0.4 mg/dL (ref 0.2–1.2)
Total Protein: 6.6 g/dL (ref 6.0–8.3)

## 2013-10-31 LAB — CBC WITH DIFFERENTIAL/PLATELET
BASOS ABS: 0 10*3/uL (ref 0.0–0.1)
Basophils Relative: 0.6 % (ref 0.0–3.0)
EOS PCT: 0.5 % (ref 0.0–5.0)
Eosinophils Absolute: 0 10*3/uL (ref 0.0–0.7)
HCT: 38.1 % (ref 36.0–46.0)
HEMOGLOBIN: 12.7 g/dL (ref 12.0–15.0)
LYMPHS PCT: 32.5 % (ref 12.0–46.0)
Lymphs Abs: 1.9 10*3/uL (ref 0.7–4.0)
MCHC: 33.4 g/dL (ref 30.0–36.0)
MCV: 88.5 fl (ref 78.0–100.0)
MONOS PCT: 6.4 % (ref 3.0–12.0)
Monocytes Absolute: 0.4 10*3/uL (ref 0.1–1.0)
NEUTROS ABS: 3.6 10*3/uL (ref 1.4–7.7)
Neutrophils Relative %: 60 % (ref 43.0–77.0)
PLATELETS: 196 10*3/uL (ref 150.0–400.0)
RBC: 4.3 Mil/uL (ref 3.87–5.11)
RDW: 13.7 % (ref 11.5–15.5)
WBC: 5.9 10*3/uL (ref 4.0–10.5)

## 2013-10-31 LAB — TSH: TSH: 0.95 u[IU]/mL (ref 0.35–4.50)

## 2013-10-31 LAB — T4, FREE: FREE T4: 0.9 ng/dL (ref 0.60–1.60)

## 2013-11-04 ENCOUNTER — Telehealth: Payer: Self-pay | Admitting: Internal Medicine

## 2013-11-04 NOTE — Telephone Encounter (Signed)
Spoke with patient and advised that her lab results where released to my-chart  Patient wanted to know the name of the psychiatrist you recommended

## 2013-11-04 NOTE — Telephone Encounter (Signed)
PT states she wants to know her lab results. She also wants to know the name of the psychiatrist that DR. Silvio Pate was going to recommend to her.   Try pt's mobile number.  Cannot understand her voicemail with her new home number.

## 2013-11-05 NOTE — Telephone Encounter (Signed)
I recommend Dr Cephus Shelling here in [place]

## 2013-11-05 NOTE — Telephone Encounter (Signed)
Left message on machine with results, advised pt to call if any questions

## 2013-11-12 ENCOUNTER — Ambulatory Visit: Payer: Medicare Other | Admitting: Internal Medicine

## 2013-11-19 ENCOUNTER — Ambulatory Visit: Payer: Self-pay | Admitting: Internal Medicine

## 2013-11-19 ENCOUNTER — Encounter: Payer: Self-pay | Admitting: Internal Medicine

## 2014-03-06 ENCOUNTER — Emergency Department: Payer: Self-pay | Admitting: Emergency Medicine

## 2014-07-29 DIAGNOSIS — M7072 Other bursitis of hip, left hip: Secondary | ICD-10-CM | POA: Insufficient documentation

## 2014-09-01 ENCOUNTER — Ambulatory Visit (INDEPENDENT_AMBULATORY_CARE_PROVIDER_SITE_OTHER): Payer: Medicare Other | Admitting: Internal Medicine

## 2014-09-01 ENCOUNTER — Encounter: Payer: Self-pay | Admitting: Internal Medicine

## 2014-09-01 VITALS — BP 100/60 | HR 60 | Temp 98.3°F | Ht 65.0 in | Wt 115.0 lb

## 2014-09-01 DIAGNOSIS — F39 Unspecified mood [affective] disorder: Secondary | ICD-10-CM | POA: Diagnosis not present

## 2014-09-01 DIAGNOSIS — M1990 Unspecified osteoarthritis, unspecified site: Secondary | ICD-10-CM | POA: Diagnosis not present

## 2014-09-01 DIAGNOSIS — Z0000 Encounter for general adult medical examination without abnormal findings: Secondary | ICD-10-CM

## 2014-09-01 DIAGNOSIS — M19049 Primary osteoarthritis, unspecified hand: Secondary | ICD-10-CM

## 2014-09-01 DIAGNOSIS — G894 Chronic pain syndrome: Secondary | ICD-10-CM

## 2014-09-01 LAB — CBC WITH DIFFERENTIAL/PLATELET
BASOS ABS: 0 10*3/uL (ref 0.0–0.1)
Basophils Relative: 0.8 % (ref 0.0–3.0)
Eosinophils Absolute: 0.2 10*3/uL (ref 0.0–0.7)
Eosinophils Relative: 4.8 % (ref 0.0–5.0)
HCT: 38.9 % (ref 36.0–46.0)
Hemoglobin: 12.7 g/dL (ref 12.0–15.0)
LYMPHS ABS: 1.6 10*3/uL (ref 0.7–4.0)
LYMPHS PCT: 32.6 % (ref 12.0–46.0)
MCHC: 32.5 g/dL (ref 30.0–36.0)
MCV: 89.4 fl (ref 78.0–100.0)
Monocytes Absolute: 0.5 10*3/uL (ref 0.1–1.0)
Monocytes Relative: 10.1 % (ref 3.0–12.0)
Neutro Abs: 2.5 10*3/uL (ref 1.4–7.7)
Neutrophils Relative %: 51.7 % (ref 43.0–77.0)
Platelets: 197 10*3/uL (ref 150.0–400.0)
RBC: 4.35 Mil/uL (ref 3.87–5.11)
RDW: 15 % (ref 11.5–15.5)
WBC: 4.9 10*3/uL (ref 4.0–10.5)

## 2014-09-01 LAB — T4, FREE: Free T4: 0.63 ng/dL (ref 0.60–1.60)

## 2014-09-01 NOTE — Progress Notes (Signed)
Subjective:    Patient ID: Katherine [name], female    DOB: 06-11-1950, 64 y.o.   MRN: [id number]  HPI Here for physical Tried to do med reconciliation but she can't find her list Did review with her and updated  Still sees pain specialist at [facility] Did cut down slightly on methadone Goes to acupuncurist for arthritis in hands--this helps  She did remember getting zostavax No flu shot last year---urged her to get this year  Current Outpatient Prescriptions on File Prior to Visit  Medication Sig Dispense Refill  . clonazePAM (KLONOPIN) 0.5 MG tablet Take 0.25 mg by mouth daily.     . cyanocobalamin (,VITAMIN B-12,) 1000 MCG/ML injection Inject 1,000 mcg into the muscle every 30 (thirty) days.    Marland Kitchen docusate sodium (COLACE) 100 MG capsule Take 100 mg by mouth daily.    Marland Kitchen FOLIC ACID PO Take 15 mg by mouth daily.    Marland Kitchen HYDROcodone-acetaminophen (NORCO/VICODIN) 5-325 MG per tablet Take 1 tablet by mouth as needed for pain.    . methadone (DOLOPHINE) 10 MG tablet Take 10 mg by mouth 4 (four) times daily.     . polyethylene glycol powder (GLYCOLAX/MIRALAX) powder MIX 17 GRAMS IN LIQUID AND DRINK DAILY. 527 g 10  . VOLTAREN 1 % GEL Apply 2 g topically 2 (two) times daily.     No current facility-administered medications on file prior to visit.    Allergies  Allergen Reactions  . Duloxetine Hcl Other (See Comments)    Other Reaction: drwsiness;unable to walk  . Sulfonamide Derivatives     Past Medical History  Diagnosis Date  . Chronic back pain     followed by pain clinic  . Scoliosis (and kyphoscoliosis), idiopathic   . Anxiety   . Retention of urine, unspecified   . Alcoholism   . Arthritis   . Depression   . Common bile duct dilatation     Past Surgical History  Procedure Laterality Date  . Abdominal hysterectomy    . Appendectomy    . Shoulder open rotator cuff repair  1994    left shoulder repair  . Breast enhancement surgery  1998  . Back surgery  10/04    Back  surgery- lumbar decompression (01/2003)  . 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 Diabetes Mother   . Hypertension Father     History   Social History  . Marital Status: Married    Spouse Name: N/A  . Number of Children: N/A  . Years of Education: N/A   Occupational History  . disabled- worked part-time at [employer] at [place]  . Smoking status: Former Smoker    Types: Cigarettes    Quit date: 06/21/2010  . Smokeless tobacco: Never Used  . Alcohol Use: No  . Drug Use: Not on file  . Sexual Activity: Not on file   Other Topics Concern  . Not on file   Social History Narrative   Review of Systems  Constitutional: Positive for unexpected weight change.       Weight is up around 10# Chronic pain affects energy levels Wears seat belt  HENT: Positive for tinnitus. Negative for dental problem and hearing loss.        Keeps up with dentist  Eyes:       Still dry  eyes and gets blurred vision by night. Uses eye drops  Respiratory: Negative for cough, chest tightness and shortness of breath.   Cardiovascular: Negative for chest pain, palpitations and leg swelling.  Gastrointestinal: Positive for constipation. Negative for nausea, vomiting and abdominal pain.  Endocrine: Negative for polydipsia and polyuria.  Genitourinary: Negative for dysuria, hematuria, difficulty urinating and dyspareunia.  Musculoskeletal: Positive for myalgias, back pain and arthralgias.  Skin: Negative for rash.  Allergic/Immunologic: Negative for environmental allergies and immunocompromised state.  Neurological: Negative for dizziness, syncope, weakness, light-headedness, numbness and headaches.       Neuropathy in feet  Hematological: Negative for adenopathy. Does not bruise/bleed easily.  Psychiatric/Behavioral: Positive for sleep disturbance. Negative for dysphoric mood. The  patient is not nervous/anxious.        Chronic sleep problems Mood okay       Objective:   Physical Exam  Constitutional: She is oriented to person, place, and time. She appears well-developed and well-nourished. No distress.  HENT:  Head: Normocephalic and atraumatic.  Right Ear: External ear normal.  Left Ear: External ear normal.  Mouth/Throat: Oropharynx is clear and moist. No oropharyngeal exudate.  Eyes: Conjunctivae and EOM are normal. Pupils are equal, round, and reactive to light.  Neck: Normal range of motion. Neck supple. No thyromegaly present.  Cardiovascular: Normal rate, regular rhythm, normal heart sounds and intact distal pulses.  Exam reveals no gallop.   No murmur heard. Pulmonary/Chest: Effort normal and breath sounds normal. No respiratory distress. She has no wheezes. She has no rales.  Abdominal: Soft. There is no tenderness.  Musculoskeletal: She exhibits no edema or tenderness.  Lymphadenopathy:    She has no cervical adenopathy.  Neurological: She is alert and oriented to person, place, and time.  Skin: No rash noted. No erythema.  Psychiatric: She has a normal mood and affect. Her behavior is normal.          Assessment & Plan:

## 2014-09-01 NOTE — Progress Notes (Signed)
Pre visit review using our clinic review tool, if applicable. No additional management support is needed unless otherwise documented below in the visit note. 

## 2014-09-01 NOTE — Assessment & Plan Note (Signed)
Dysthymia and some anxiety Largely related to chronic pain syndrome Doing better on the meds--- Rx at [facility]

## 2014-09-01 NOTE — Assessment & Plan Note (Signed)
Fairly healthy Due for mammogram next year--no breast exam after discussion Colonoscopy due 2021 Needs yearly flu vaccine

## 2014-09-01 NOTE — Assessment & Plan Note (Signed)
She has only slight puffiness in PIPs---no inflammation No Rx indicated for this

## 2014-09-01 NOTE — Assessment & Plan Note (Signed)
After failed back surgery Methadone per [facility] pain clinic

## 2015-01-14 ENCOUNTER — Ambulatory Visit: Payer: Medicare Other

## 2015-01-18 ENCOUNTER — Ambulatory Visit: Payer: Medicare Other

## 2015-02-19 ENCOUNTER — Ambulatory Visit (INDEPENDENT_AMBULATORY_CARE_PROVIDER_SITE_OTHER): Payer: Medicare Other

## 2015-02-19 DIAGNOSIS — Z23 Encounter for immunization: Secondary | ICD-10-CM

## 2015-07-30 DIAGNOSIS — M0579 Rheumatoid arthritis with rheumatoid factor of multiple sites without organ or systems involvement: Secondary | ICD-10-CM | POA: Insufficient documentation

## 2015-09-15 ENCOUNTER — Encounter: Payer: Self-pay | Admitting: Physical Therapy

## 2015-09-15 ENCOUNTER — Ambulatory Visit: Payer: PPO | Attending: Obstetrics and Gynecology | Admitting: Physical Therapy

## 2015-09-15 DIAGNOSIS — M7989 Other specified soft tissue disorders: Secondary | ICD-10-CM | POA: Diagnosis present

## 2015-09-15 DIAGNOSIS — M6281 Muscle weakness (generalized): Secondary | ICD-10-CM | POA: Insufficient documentation

## 2015-09-15 NOTE — Patient Instructions (Signed)
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

## 2015-09-16 NOTE — Therapy (Addendum)
[place] MAIN [facility] SERVICES [address] [place], [place], [address] Phone: [phone]   Fax:  [phone]  Physical Therapy Evaluation  Patient Details  Name: Katherine [name] MRN: [id number] Date of Birth: [birth year] Referring Provider: Leafy Ro  Encounter Date: 09/15/2015    Past Medical History  Diagnosis Date  . Chronic back pain     followed by pain clinic  . Scoliosis (and kyphoscoliosis), idiopathic   . Anxiety   . Retention of urine, unspecified   . Alcoholism ([place])   . Depression   . Common bile duct dilatation   . Arthritis     Rheumatoid     Past Surgical History  Procedure Laterality Date  . Abdominal hysterectomy    . Appendectomy    . Shoulder open rotator cuff repair  1994    left shoulder repair  . Breast enhancement surgery  1998  . Back surgery  10/04    Back surgery- lumbar decompression (01/2003)  . 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       Subjective Assessment - 09/16/15 1022    Subjective    1) chronic back pain for 10 years following back surgery. Localized at SIJ and radiates down L lateral thigh to knee level.  Radiating pain occurs 3-4x a month and pt can not relate it occuring to anything but feels like it is related to bursa pain. Pt applies a cream and it alleviates the bursa "goes down".   Pt also reported she has had swollen area above the iliac crests B for the past years and has noticed these areas have increased in size over the past 2 weeks which causes alot pain. At worst 8/10, Currently 1/10. Pt experiences she has no joy in her life, and used to be active (mountain biking, hiking 40,000 ft mtn) and cooked pies to take toher neighbors. Pt has pain everyday and tries not to use her cane but needs it for a L foot drag.    2) pubic pain statrted 3-4 weeks ago. Pt has been increasing her walking  distance but has had to stop completely. Pt was able to walk 13000 steps prior to onset of pain. On average to was able to walk 8000 steps.   3) Urinary dysfunctions: : pt reports she has a hard time initiating urination for the past 6 months. Frequency occurs every 2 hrs. Pt has to strain to urinate.   4) constipation/ diarrhea:  frequency ranges from daily to 5 days.  bristol types range 1-4, 6   5) stress and anxiety: pt reports her RA flare-up occurs with stress,. Pt finds arguing with her husband can be stressful.  Pt no longer takes anxiety medication and would like to learn relaxation techniques. "I am aware of breathing but not good at it". Pt finds her mind is all over the place and her psychologist told her that her mind "ruminates".       Patient is accompained by: Family member  husband in waiting    Pertinent History Hobbies: husband does gardening, pt prunes and directs gardening plan. Pt does laundry. Enjoy cooking meals. Pt uses acupuncture for stress, pain, RA and "everything".    Limitations Sitting;Walking;Standing   How long can you sit comfortably? sit max 30 min   How long can you stand comfortably? stand 10 mins  How long can you walk comfortably? 20 mins    Patient Stated Goals get out some of the this pain to have a better personality at 1-2/10 tolerable level , have intimacy with husband, ironing clothes  "I want my life back"             [facility] PT Assessment - 09/16/15 1019    Assessment   Medical Diagnosis dysparenia   Referring Provider Leafy Ro   Precautions   Precautions None   Restrictions   Weight Bearing Restrictions No   Balance Screen   Has the patient fallen in the past 6 months No   Observation/Other Assessments   Other Surveys  --  PFDI 60% , ODI 64%    Single Leg Stance   Comments 5 sec B    Palpation   Spinal mobility scoliosis : L lumbar spine more posterior with increased paraspinal mm tension    SI assessment  R PSIS more  posterior, increased mm tension mm near iliac crest with tenderness    Palpation comment tenderness at pubic symphysis                  Pelvic Floor Special Questions - 09/16/15 1019    Diastasis Recti 3 fingers wdith below strnum           [facility] Adult PT Treatment/Exercise - 09/16/15 1021    Therapeutic Activites    Therapeutic Activities --  see pt instructions   Neuro Re-ed    Neuro Re-ed Details  see pt instructions                     PT Long Term Goals - 09/16/15 1027    PT LONG TERM GOAL #1   Title Pt will decrease her ODI score from 64% to < 54% in order to return to ADLs.    Time 12   Period Weeks   Status New   PT LONG TERM GOAL #2   Title Pt will decrease her score on PFDI from 60% to < 50% in order to restore urinary and bowel function   Time 12   Period Weeks   Status New   PT LONG TERM GOAL #3   Title Pt will demo decrease in fingers width in abdominal separation from 3 to < 2 fingers in order to progress to deep core strengthening and to minimize low back pain   Time 12   Period Weeks   Status New   PT LONG TERM GOAL #4   Title Pt will demo increase SLS time to > 25 sec bilaterally in order to progress to higher functioning exercises such as stair climbing.   Time 12   Period Weeks   Status New   PT LONG TERM GOAL #5   Title Pt will demo proper body mechanics with house hold chores and gardening in order participate in activaities with husband and at home   Time 12   Period Weeks   Status New               Plan - 09/16/15 1022    Clinical Impression Statement Pt is a 65 yo female who c/o LBP, pelvic pain, urinary and bowel problems that impact her social life and ADLs. Pt 's clinical presentations include scoliosis, pelvic obliquities, poor body mechanics, DRA of 3 fingers width, poor postural and deep core mm coordination, and generalized weakness.    Patient will benefit from skilled therapeutic intervention in order to  improve the following deficits and impairments:  Pain, Improper body mechanics, Increased fascial restricitons, Increased muscle spasms, Decreased scar mobility, Decreased coordination, Decreased mobility, Decreased strength, Decreased safety awareness, Decreased balance, Decreased endurance, Decreased range of motion, Decreased activity tolerance   Clinical Impairments Affecting Rehab Potential stress and anxiety,  RA, lumbar surgery   PT Frequency 1x / week   PT Duration 12 weeks   PT Treatment/Interventions ADLs/Self Care Home Management;Aquatic Therapy;Gait training;Moist Heat;Stair training;Functional mobility training;Therapeutic activities;Traction;Therapeutic exercise;Balance training;Neuromuscular re-education;Manual lymph drainage;Manual techniques;Orthotic Fit/Training;Patient/family education;Scar mobilization;Passive range of motion;Dry needling;Energy conservation;Taping;Splinting   Consulted and Agree with Plan of Care Patient      Patient will benefit from skilled therapeutic intervention in order to improve the following deficits and impairments:  Pain, Improper body mechanics, Increased fascial restricitons, Increased muscle spasms, Decreased scar mobility, Decreased coordination, Decreased mobility, Decreased strength, Decreased safety awareness, Decreased balance, Decreased endurance, Decreased range of motion, Decreased activity tolerance  Visit Diagnosis: Muscle weakness (generalized)  Other specified soft tissue disorders     Problem List Patient Active Problem List   Diagnosis Date Noted  . Chronic pain syndrome 09/01/2014  . Heart palpitations 10/30/2013  . Routine general medical examination at a health care facility 06/28/2012  . Arthritis of hand 02/11/2010  . CONSTIPATION 04/05/2009  . Episodic mood disorder ([place]) 07/21/2008  . SCOLIOSIS 07/21/2008    Jerl Mina ,PT, DPT, E-RYT  10/12/2015, 10:30 AM  [facility]  MAIN [facility] SERVICES [address] [place], [place], [address] Phone: [phone]   Fax:  [phone]  Name: PRINCES SABBATH MRN: [id number] Date of Birth: 1950/06/26

## 2015-09-29 ENCOUNTER — Ambulatory Visit: Payer: PPO | Admitting: Physical Therapy

## 2015-09-29 DIAGNOSIS — M6281 Muscle weakness (generalized): Secondary | ICD-10-CM | POA: Diagnosis not present

## 2015-09-29 DIAGNOSIS — M7989 Other specified soft tissue disorders: Secondary | ICD-10-CM

## 2015-09-30 NOTE — Therapy (Addendum)
[place] MAIN [facility] SERVICES [address] [place], [place], [address] Phone: [phone]   Fax:  [phone]  Physical Therapy Treatment  Patient Details  Name: Katherine [name] MRN: [id number] Date of Birth: 05-18-1950 Referring Provider: Leafy Ro  Encounter Date: 09/29/2015      PT End of Session - 10/12/15 1459    Visit Number 2   Number of Visits 12   Date for PT Re-Evaluation 12/01/15   PT Start Time [id number]   PT Stop Time 1505   PT Time Calculation (min) 50 min   Activity Tolerance Patient tolerated treatment well;No increased pain   Behavior During Therapy [facility] for tasks assessed/performed      Past Medical History  Diagnosis Date  . Chronic back pain     followed by pain clinic  . Scoliosis (and kyphoscoliosis), idiopathic   . Anxiety   . Retention of urine, unspecified   . Alcoholism ([place])   . Depression   . Common bile duct dilatation   . Arthritis     Rheumatoid     Past Surgical History  Procedure Laterality Date  . Abdominal hysterectomy    . Appendectomy    . Shoulder open rotator cuff repair  1994    left shoulder repair  . Breast enhancement surgery  1998  . Back surgery  10/04    Back surgery- lumbar decompression (01/2003)  . 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      Subjective Assessment - 09/30/15 1457    Subjective Pt reported she feels very anxious and does not know how to relax. 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   Patient is accompained by: Family member  husband in waiting    Pertinent History Hobbies: husband does gardening, pt prunes and directs gardening plan. Pt does laundry. Enjoy cooking meals. Pt uses acupuncture for stress, pain, RA and "everything".    Limitations Sitting;Walking;Standing   How long can you sit comfortably? sit max 30 min   How long can you  stand comfortably? stand 10 mins   How long can you walk comfortably? 20 mins    Patient Stated Goals get out some of the this pain to have a better personality at 1-2/10 tolerable level , have intimacy with husband, ironing clothes  "I want my life back"             [facility] PT Assessment - 09/30/15 1457    Observation/Other Assessments   Observations anxious demeanor, slumped posture                 [facility] Adult PT Treatment/Exercise - 09/30/15 1458    Neuro Re-ed    Neuro Re-ed Details  guided body scan and explained research-based benefits   Manual Therapy   Manual therapy comments light manual therapy                PT Education - 09/30/15 1459    Education provided Yes   Education Details HEP, body scan and benefits to relax and release mm tensions   Person(s) Educated Patient   Methods Explanation;Demonstration;Tactile cues;Verbal cues;Handout   Comprehension Verbalized understanding;Returned demonstration             PT Long Term Goals - 09/30/15 1027    PT LONG TERM GOAL #1   Title Pt  will decrease her ODI score from 64% to < 54% in order to return to ADLs.    Time 12   Period Weeks   Status New   PT LONG TERM GOAL #2   Title Pt will decrease her score on PFDI from 60% to < 50% in order to restore urinary and bowel function   Time 12   Period Weeks   Status New   PT LONG TERM GOAL #3   Title Pt will demo decrease in fingers width in abdominal separation from 3 to < 2 fingers in order to progress to deep core strengthening and to minimize low back pain   Time 12   Period Weeks   Status New   PT LONG TERM GOAL #4   Title Pt will demo increase SLS time to > 25 sec bilaterally in order to progress to higher functioning exercises such as stair climbing.   Time 12   Period Weeks   Status New   PT LONG TERM GOAL #5   Title Pt will demo proper body mechanics with house hold chores and gardening in order participate in activaities with husband and  at home   Time 12   Period Weeks   Status New               Plan - 09/30/15 1500    Clinical Impression Statement Pt reported feeling much better and more relaxed following body scan technique and light manual Tx. Pt will continue to benefit from skilled PT. Plan to assess and treat DRA and pelvic obliquities at next session. Anticipate pt will progress well when pt has stress management techniques in order to remain compliant to HEP and for optimal learning.      Clinical Impairments Affecting Rehab Potential stress and anxiety,  RA, lumbar surgery   PT Frequency 1x / week   PT Duration 12 weeks   PT Treatment/Interventions ADLs/Self Care Home Management;Aquatic Therapy;Gait training;Moist Heat;Stair training;Functional mobility training;Therapeutic activities;Traction;Therapeutic exercise;Balance training;Neuromuscular re-education;Manual lymph drainage;Manual techniques;Orthotic Fit/Training;Patient/family education;Scar mobilization;Passive range of motion;Dry needling;Energy conservation;Taping;Splinting   Consulted and Agree with Plan of Care Patient      Patient will benefit from skilled therapeutic intervention in order to improve the following deficits and impairments:  Pain, Improper body mechanics, Increased fascial restricitons, Increased muscle spasms, Decreased scar mobility, Decreased coordination, Decreased mobility, Decreased strength, Decreased safety awareness, Decreased balance, Decreased endurance, Decreased range of motion, Decreased activity tolerance  Visit Diagnosis: Muscle weakness (generalized)  Other specified soft tissue disorders     Problem List Patient Active Problem List   Diagnosis Date Noted  . Chronic pain syndrome 09/01/2014  . Heart palpitations 10/30/2013  . Routine general medical examination at a health care facility 06/28/2012  . Arthritis of hand 02/11/2010  . CONSTIPATION 04/05/2009  . Episodic mood disorder ([place]) 07/21/2008  .  SCOLIOSIS 07/21/2008    Jerl Mina ,PT, DPT, E-RYT  10/12/2015, 3:02 PM  [place] MAIN [facility] SERVICES [address] [place], [place], [address] Phone: [phone]   Fax:  [phone]  Name: Katherine [name] MRN: [id number] Date of Birth: [birth year]

## 2015-10-07 ENCOUNTER — Ambulatory Visit: Payer: PPO | Admitting: Physical Therapy

## 2015-10-12 ENCOUNTER — Ambulatory Visit: Payer: PPO | Attending: Obstetrics and Gynecology | Admitting: Physical Therapy

## 2015-10-12 DIAGNOSIS — M6281 Muscle weakness (generalized): Secondary | ICD-10-CM | POA: Diagnosis not present

## 2015-10-12 DIAGNOSIS — M7989 Other specified soft tissue disorders: Secondary | ICD-10-CM | POA: Diagnosis present

## 2015-10-12 NOTE — Addendum Note (Signed)
Addended by: Jerl Mina on: 10/12/2015 10:33 AM   Modules accepted: Orders

## 2015-10-12 NOTE — Therapy (Addendum)
[place] MAIN [facility] SERVICES [address] [place], [place], [address] Phone: [phone]   Fax:  [phone]  Physical Therapy Treatment  Patient Details  Name: Katherine [name] MRN: [id number] Date of Birth: 25-Sep-1950 Referring Provider: Leafy Ro  Encounter Date: 10/12/2015      PT End of Session - 10/12/15 1552    Visit Number 3   Number of Visits 12   Date for PT Re-Evaluation 12/01/15   PT Start Time [id number]   PT Stop Time 1610   PT Time Calculation (min) 54 min   Activity Tolerance Patient tolerated treatment well;No increased pain   Behavior During Therapy [facility] for tasks assessed/performed      Past Medical History  Diagnosis Date  . Chronic back pain     followed by pain clinic  . Scoliosis (and kyphoscoliosis), idiopathic   . Anxiety   . Retention of urine, unspecified   . Alcoholism ([place])   . Depression   . Common bile duct dilatation   . Arthritis     Rheumatoid     Past Surgical History  Procedure Laterality Date  . Abdominal hysterectomy    . Appendectomy    . Shoulder open rotator cuff repair  1994    left shoulder repair  . Breast enhancement surgery  1998  . Back surgery  10/04    Back surgery- lumbar decompression (01/2003)  . 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      Subjective Assessment - 10/12/15 1525    Subjective Pt reported using the body scan and it has worked but she would like to maintain it throughout day. Pt feels more relaxed after using the technique. Pt reported she is not straining as much as she used to. Pt was able to walk 8 miles this past weekend.  Today, pt's back is at 3/10 at upper R side.    Patient is accompained by: Family member  husband in waiting    Pertinent History Hobbies: husband does gardening, pt prunes and directs gardening plan. Pt does laundry. Enjoy cooking meals. Pt uses  acupuncture for stress, pain, RA and "everything".    Limitations Sitting;Walking;Standing   How long can you sit comfortably? sit max 30 min   How long can you stand comfortably? stand 10 mins   How long can you walk comfortably? 20 mins    Patient Stated Goals get out some of the this pain to have a better personality at 1-2/10 tolerable level , have intimacy with husband, ironing clothes  "I want my life back"             [facility] PT Assessment - 10/12/15 1536    Palpation   Spinal mobility R ribcage more anterior, mm tensions along R paraspinal and R external oblique                  Pelvic Floor Special Questions - 10/12/15 1531    Diastasis Recti  no fingers width            [facility] Adult PT Treatment/Exercise - 10/12/15 1556    Exercises   Exercises --  see pt instructions   Manual Therapy   Manual therapy comments gentle distraction in sidelying between R ribcage and iliac crest for posterior rotation of ribcage  PT Education - 10/12/15 1551    Education provided Yes   Education Details HEP    Person(s) Educated Patient   Methods Explanation;Demonstration;Tactile cues;Verbal cues;Handout   Comprehension Returned demonstration;Verbalized understanding             PT Long Term Goals - 10/12/15 1027    PT LONG TERM GOAL #1   Title Pt will decrease her ODI score from 64% to < 54% in order to return to ADLs.    Time 12   Period Weeks   Status New   PT LONG TERM GOAL #2   Title Pt will decrease her score on PFDI from 60% to < 50% in order to restore urinary and bowel function   Time 12   Period Weeks   Status New   PT LONG TERM GOAL #3   Title Pt will demo decrease in fingers width in abdominal separation from 3 to < 2 fingers in order to progress to deep core strengthening and to minimize low back pain   Time 12   Period Weeks   Status New   PT LONG TERM GOAL #4   Title Pt will demo increase SLS time to > 25 sec  bilaterally in order to progress to higher functioning exercises such as stair climbing.   Time 12   Period Weeks   Status New   PT LONG TERM GOAL #5   Title Pt will demo proper body mechanics with house hold chores and gardening in order participate in activaities with husband and at home   Time 12   Period Weeks   Status New               Plan - 10/12/15 1558    Clinical Impression Statement  Pt reported a decrease in 3/10 pain in her R back to 0/10 post -Tx. Pt reported she no longer feels the pain radiating from her back to the front rib.  Initiated stretching and strengthening HEP to manage pain 2/2 scoliosis. Pt remains compliant with HEP but required biopsychosocial approaches and encouragement to maintain stress management strategies to decrease mm tensions and pain. Pt's DRA has improved and she will be ready for deep core strengthening exercises at next session. Pt continues to benefit from skilled PT.    Clinical Impairments Affecting Rehab Potential stress and anxiety,  RA, lumbar surgery   PT Frequency 1x / week   PT Duration 12 weeks   PT Treatment/Interventions ADLs/Self Care Home Management;Aquatic Therapy;Gait training;Moist Heat;Stair training;Functional mobility training;Therapeutic activities;Traction;Therapeutic exercise;Balance training;Neuromuscular re-education;Manual lymph drainage;Manual techniques;Orthotic Fit/Training;Patient/family education;Scar mobilization;Passive range of motion;Dry needling;Energy conservation;Taping;Splinting   Consulted and Agree with Plan of Care Patient      Patient will benefit from skilled therapeutic intervention in order to improve the following deficits and impairments:  Pain, Improper body mechanics, Increased fascial restricitons, Increased muscle spasms, Decreased scar mobility, Decreased coordination, Decreased mobility, Decreased strength, Decreased safety awareness, Decreased balance, Decreased endurance, Decreased range  of motion, Decreased activity tolerance  Visit Diagnosis: Muscle weakness (generalized)  Other specified soft tissue disorders     Problem List Patient Active Problem List   Diagnosis Date Noted  . Chronic pain syndrome 09/01/2014  . Heart palpitations 10/30/2013  . Routine general medical examination at a health care facility 06/28/2012  . Arthritis of hand 02/11/2010  . CONSTIPATION 04/05/2009  . Episodic mood disorder ([place]) 07/21/2008  . SCOLIOSIS 07/21/2008    Jerl Mina ,PT, DPT, E-RYT  10/12/2015, 4:08 PM  [place]  [facility] MAIN [facility] SERVICES [place], [place], [address] Phone: [phone]   Fax:  [phone]  Name: Katherine [name] MRN: [id number] Date of Birth: Apr 18, 1950

## 2015-10-12 NOTE — Patient Instructions (Addendum)
Do these exercises 2x day   Open book 15 reps   Lay on L side with a pillow under curve between ribcage and pelvis Pillow between knees and behind back,   (move arms to the right)     ____________   Right side bend in seated position 15 reps  Seated on a strap pulling end of the strap in R arm as you lean to the Left.       _____________  Modified sideplank by a counter on R side 5 reps x 5 sec hold       ____________ Body scan  With hand towel under R ribcage when laying on your back to fill the space from scoliosis

## 2015-10-12 NOTE — Patient Instructions (Addendum)
Emailed Public relations account executive of body scan and research links

## 2015-10-26 ENCOUNTER — Ambulatory Visit: Payer: PPO | Admitting: Physical Therapy

## 2015-11-08 ENCOUNTER — Ambulatory Visit: Payer: PPO | Attending: Obstetrics and Gynecology | Admitting: Physical Therapy

## 2015-11-08 DIAGNOSIS — M6281 Muscle weakness (generalized): Secondary | ICD-10-CM | POA: Diagnosis not present

## 2015-11-08 DIAGNOSIS — M7989 Other specified soft tissue disorders: Secondary | ICD-10-CM

## 2015-11-08 NOTE — Patient Instructions (Signed)
   Body scan  10 reps of these stretches for your back and midback muscles before getting out bed in the morning, before bed, and after walks  _Knees to chest with strap under thighs  _figure 4 with strap under thigh that is not crossed  _Sidelying on left, with open book with pillow behind back and between knees, brush left hand across across R arm , ribcage and relax hand over L ribcage while relaxing over the pillow.  _pelvic tilts    _________  Maintain body scan also before bed

## 2015-11-08 NOTE — Therapy (Signed)
[place] MAIN [facility] SERVICES [address] [place], [place], [address] Phone: [phone]   Fax:  [phone]  Physical Therapy Treatment  Patient Details  Name: Katherine [name] MRN: [id number] Date of Birth: 1950/10/24 Referring Provider: Leafy Ro  Encounter Date: 11/08/2015      PT End of Session - 11/08/15 1602    Visit Number 4   Number of Visits 12   Date for PT Re-Evaluation 12/01/15   PT Start Time 1510   PT Stop Time 1610   PT Time Calculation (min) 60 min   Equipment Utilized During Treatment Gait belt   Activity Tolerance No increased pain;Patient tolerated treatment well   Behavior During Therapy [facility] for tasks assessed/performed      Past Medical History:  Diagnosis Date  . Alcoholism ([place])   . Anxiety   . Arthritis    Rheumatoid   . Chronic back pain    followed by pain clinic  . Common bile duct dilatation   . Depression   . Retention of urine, unspecified   . Scoliosis (and kyphoscoliosis), idiopathic     Past Surgical History:  Procedure Laterality Date  . ABDOMINAL HYSTERECTOMY    . APPENDECTOMY    . BACK SURGERY  10/04   Back surgery- lumbar decompression (01/2003)  . BREAST ENHANCEMENT SURGERY  1998  . 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 SHOULDER OPEN ROTATOR CUFF REPAIR  1994   left shoulder repair    There were no vitals filed for this visit.      Subjective Assessment - 11/08/15 1513    Subjective Pt reported she has been practicing mindfulness each night. Pt's low back pain has been bothering. Pt's pelvic pain has been bothering her.     Patient is accompained by: Family member  husband in waiting    Pertinent History Hobbies: husband does gardening, pt prunes and directs gardening plan. Pt does laundry. Enjoy cooking meals. Pt uses acupuncture for stress, pain, RA and "everything".    Limitations Sitting;Walking;Standing   How long can you  sit comfortably? sit max 30 min   How long can you stand comfortably? stand 10 mins   How long can you walk comfortably? 20 mins    Patient Stated Goals get out some of the this pain to have a better personality at 1-2/10 tolerable level , have intimacy with husband, ironing clothes  "I want my life back"             [name] PT Assessment - 11/08/15 1547      Palpation   SI assessment  L PSIS decreased tenderess post manual Tx along L midback and ther ex to decrease paraspinal mm tensions   Palpation comment significant mm tensions along L midback mm   signifciant mm tensions along lumbar paraspinals B      Ambulation/Gait   Gait Comments less limping noted post Tx                  Pelvic Floor Special Questions - 11/08/15 1529    Diastasis Recti  no fingers width            [facility] Adult PT Treatment/Exercise - 11/08/15 1551      Therapeutic Activites    Therapeutic Activities --  activity pacing principles     Neuro Re-ed    Neuro Re-ed Details  education on energy conservations (short duration  of activities), principles of stretching not to the point of pain, reviewed open book      Manual Therapy   Manual therapy comments midback STM release along L shoulders                PT Education - 11/08/15 1601    Education provided Yes   Education Details HEP   Person(s) Educated Patient   Methods Explanation;Demonstration;Verbal cues;Tactile cues;Handout   Comprehension Returned demonstration;Verbalized understanding             PT Long Term Goals - 10/12/15 1027      PT LONG TERM GOAL #1   Title Pt will decrease her ODI score from 64% to < 54% in order to return to ADLs.    Time 12   Period Weeks   Status New     PT LONG TERM GOAL #2   Title Pt will decrease her score on PFDI from 60% to < 50% in order to restore urinary and bowel function   Time 12   Period Weeks   Status New     PT LONG TERM GOAL #3   Title Pt will demo decrease in  fingers width in abdominal separation from 3 to < 2 fingers in order to progress to deep core strengthening and to minimize low back pain   Time 12   Period Weeks   Status New     PT LONG TERM GOAL #4   Title Pt will demo increase SLS time to > 25 sec bilaterally in order to progress to higher functioning exercises such as stair climbing.   Time 12   Period Weeks   Status New     PT LONG TERM GOAL #5   Title Pt will demo proper body mechanics with house hold chores and gardening in order participate in activaities with husband and at home   Time 12   Period Weeks   Status New               Plan - 11/08/15 1602    Clinical Impression Statement Pt reported her LBP and pelvic pain resolved after the session as pt demonstrated less gait deviations, decreased paraspinal mm tensions, and decressed tenderness at L PSIS . Pt reported after the session, pt feels like "everything in a different place and her hips move easier and legs are more steady and in place."  Pt feels relaxed like she had a "full body massage." Pt responded well to manual Tx and HEP specially prescribed for pt's scoliotic curve. Suspect pt's pelvic pain is associated with tight parspinal mm 2/2 scoliosis. Pt's DRA has resolved. Anticipate pt will continue to progress towards her goals. Provided pt education on activity pacing and emphasized consistency of practicing mindfulness and relaxation/ stretching HEP. Pt voiced understanding.     Clinical Impairments Affecting Rehab Potential stress and anxiety,  RA, lumbar surgery   PT Frequency 1x / week   PT Duration 12 weeks   PT Treatment/Interventions ADLs/Self Care Home Management;Aquatic Therapy;Gait training;Moist Heat;Stair training;Functional mobility training;Therapeutic activities;Traction;Therapeutic exercise;Balance training;Neuromuscular re-education;Manual lymph drainage;Manual techniques;Orthotic Fit/Training;Patient/family education;Scar mobilization;Passive  range of motion;Dry needling;Energy conservation;Taping;Splinting   Consulted and Agree with Plan of Care Patient      Patient will benefit from skilled therapeutic intervention in order to improve the following deficits and impairments:  Pain, Improper body mechanics, Increased fascial restricitons, Increased muscle spasms, Decreased scar mobility, Decreased coordination, Decreased mobility, Decreased strength, Decreased safety awareness, Decreased balance, Decreased endurance, Decreased range  of motion, Decreased activity tolerance  Visit Diagnosis: Muscle weakness (generalized)  Other specified soft tissue disorders     Problem List Patient Active Problem List   Diagnosis Date Noted  . Chronic pain syndrome 09/01/2014  . Heart palpitations 10/30/2013  . Routine general medical examination at a health care facility 06/28/2012  . Arthritis of hand 02/11/2010  . CONSTIPATION 04/05/2009  . Episodic mood disorder ([place]) 07/21/2008  . SCOLIOSIS 07/21/2008    Jerl Mina ,PT, DPT, E-RYT  11/08/2015, 4:57 PM  [place] MAIN [facility] SERVICES [address] [place], [place], [address] Phone: [phone]   Fax:  [phone]  Name: JACQUALYN LAUDICINA MRN: [id number] Date of Birth: 1950/06/28

## 2015-11-22 ENCOUNTER — Encounter: Payer: PPO | Admitting: Physical Therapy

## 2015-11-24 ENCOUNTER — Ambulatory Visit: Payer: PPO | Admitting: Physical Therapy

## 2015-11-24 DIAGNOSIS — M6281 Muscle weakness (generalized): Secondary | ICD-10-CM | POA: Diagnosis not present

## 2015-11-24 DIAGNOSIS — M7989 Other specified soft tissue disorders: Secondary | ICD-10-CM

## 2015-11-24 NOTE — Therapy (Signed)
[place] MAIN [facility] SERVICES [address] [place], [place], [address] Phone: [phone]   Fax:  [phone]  Physical Therapy Treatment [place] [facility] Note  Patient Details  Name: Katherine [name] MRN: [id number] Date of Birth: [birth year] Referring Provider: Leafy Ro  Encounter Date: 11/24/2015      PT End of Session - 11/24/15 1444    Visit Number 5   Number of Visits 12   Date for PT Re-Evaluation 12/01/15   PT Start Time [id number]   PT Stop Time 1510   PT Time Calculation (min) 56 min   Activity Tolerance Patient tolerated treatment well;No increased pain   Behavior During Therapy [facility] for tasks assessed/performed      Past Medical History:  Diagnosis Date  . Alcoholism ([place])   . Anxiety   . Arthritis    Rheumatoid   . Chronic back pain    followed by pain clinic  . Common bile duct dilatation   . Depression   . Retention of urine, unspecified   . Scoliosis (and kyphoscoliosis), idiopathic     Past Surgical History:  Procedure Laterality Date  . ABDOMINAL HYSTERECTOMY    . APPENDECTOMY    . BACK SURGERY  10/04   Back surgery- lumbar decompression (01/2003)  . BREAST ENHANCEMENT SURGERY  1998  . 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 SHOULDER OPEN ROTATOR CUFF REPAIR  1994   left shoulder repair    There were no vitals filed for this visit.      Subjective Assessment - 11/24/15 1423    Subjective Pt reported her BP has been high in the 160s / 90s when she was stressed or when she is in the heat ouside. pt has been monitoring her BP. Since last session, pt reported her LBP and pelvic pain resolved for 2 days until pt got on her bicyle and rode "fast".  Pain relapsed that lasted for 6 days.  Today, pt feels pretty good , 2/10. Pt reports her HEP to be helpful. Pt has been practicing gratitude which has also helped her to manage her stress.    Patient is accompained by: Family  member  husband in waiting    Pertinent History Hobbies: husband does gardening, pt prunes and directs gardening plan. Pt does laundry. Enjoy cooking meals. Pt uses acupuncture for stress, pain, RA and "everything".    Limitations Sitting;Walking;Standing   How long can you sit comfortably? sit max 30 min   How long can you stand comfortably? stand 10 mins   How long can you walk comfortably? 20 mins    Patient Stated Goals get out some of the this pain to have a better personality at 1-2/10 tolerable level , have intimacy with husband, ironing clothes  "I want my life back"             [facility] PT Assessment - 11/24/15 1426      Observation/Other Assessments   Observations more calm demeanor      Palpation   Palpation comment signficantly decreased mm tensions along midback, T 4 more L laterally shifted with limited mobility      Ambulation/Gait   Gait Comments no antalgic gait                   Pelvic Floor Special Questions - 11/24/15 1449    Diastasis Recti  no fingers width  PT Education - 11/24/15 1444    Education provided Yes   Education Details HEP   Person(s) Educated Patient   Methods Explanation;Demonstration;Tactile cues;Verbal cues;Handout   Comprehension Verbalized understanding;Returned demonstration             PT Long Term Goals - 11/24/15 1448      PT LONG TERM GOAL #1   Title Pt will decrease her ODI score from 64% to < 54% in order to return to ADLs.  (11/24/15: 44%)    Time 12   Period Weeks   Status Achieved     PT LONG TERM GOAL #2   Title Pt will decrease her score on PFDI from 60% to < 50% in order to restore urinary and bowel function  (11/24/15 : 46%)    Time 12   Period Weeks   Status Achieved     PT LONG TERM GOAL #3   Title Pt will demo decrease in fingers width in abdominal separation from 3 to < 2 fingers in order to progress to deep core strengthening and to minimize low back pain   Time  12   Period Weeks   Status Achieved     PT LONG TERM GOAL #4   Title Pt will demo increase SLS time to > 25 sec bilaterally in order to progress to higher functioning exercises such as stair climbing.   Time 12   Period Weeks   Status On-going     PT LONG TERM GOAL #5   Title Pt will demo proper body mechanics with house hold chores and gardening in order participate in activaities with husband and at home   Time 12   Period Weeks   Status On-going               Plan - 11/24/15 1445    Clinical Impression Statement Pt has achieved 4/6 goals with significantly decreased scores on ODI from 64% to 44% and PFDI from 60% to 46%. Pt showed resolved diastasis recti, decreased mm tensions along spine, and more symmetrial alignment of SIJ from last session which was 2 weeks ago. Pt also appeared more calm compared to last session. Pt reported increased BP with stress and being in the heat outside (160s/90s) but her BP today was 126/54 mm Hg.  Pt is progressing well towards deep core strengthening and a new ROM exercise was added to address her thoracic scoliotic curve.  Pt demo'd less hypomobility along T4 following manual Tx. Pt continues to progress twoards her goals.    Clinical Impairments Affecting Rehab Potential stress and anxiety,  RA, lumbar surgery   PT Frequency 1x / week   PT Duration 12 weeks   PT Treatment/Interventions ADLs/Self Care Home Management;Aquatic Therapy;Gait training;Moist Heat;Stair training;Functional mobility training;Therapeutic activities;Traction;Therapeutic exercise;Balance training;Neuromuscular re-education;Manual lymph drainage;Manual techniques;Orthotic Fit/Training;Patient/family education;Scar mobilization;Passive range of motion;Dry needling;Energy conservation;Taping;Splinting   Consulted and Agree with Plan of Care Patient      Patient will benefit from skilled therapeutic intervention in order to improve the following deficits and impairments:   Pain, Improper body mechanics, Increased fascial restricitons, Increased muscle spasms, Decreased scar mobility, Decreased coordination, Decreased mobility, Decreased strength, Decreased safety awareness, Decreased balance, Decreased endurance, Decreased range of motion, Decreased activity tolerance  Visit Diagnosis: Muscle weakness (generalized)  Other specified soft tissue disorders     Problem List Patient Active Problem List   Diagnosis Date Noted  . Chronic pain syndrome 09/01/2014  . Heart palpitations 10/30/2013  . Routine general medical examination  at a health care facility 06/28/2012  . Arthritis of hand 02/11/2010  . CONSTIPATION 04/05/2009  . Episodic mood disorder ([place]) 07/21/2008  . SCOLIOSIS 07/21/2008    Jerl Mina ,PT, DPT, E-RYT  11/24/2015, 3:25 PM  [place] MAIN [facility] SERVICES [address] [place], [place], [address] Phone: [phone]   Fax:  [phone]  Name: Katherine [name] MRN: [id number] Date of Birth: Aug 19, 1950

## 2015-11-24 NOTE — Patient Instructions (Addendum)
Add this stretch for releasing upper back    Laying down  R ear towards R shoulder, chin down and look towards R armpit,  Reach with L hand towards feet to lengthen the gentle pull along the L side of the neck   breathe 3 breaths  5 reps  _______________  For deep core strengthening  You are now ready to begin training the deep core muscles system: diaphragm, transverse abdominis, pelvic floor . These muscles must work together as a team.         The key to these exercises to train the brain to coordinate the timing of these muscles and to have them turn on for long periods of time to hold you upright against gravity (especially important if you are on your feet all day).These muscles are postural muscles and play a role stabilizing your spine and bodyweight. By doing these repetitions slowly and correctly instead of doing crunches, you will achieve a flatter belly without a lower pooch. You are also placing your spine in a more neutral position and breathing properly which in turn, decreases your risk for problems related to your pelvic floor, abdominal, and low back such as pelvic organ prolapse, hernias, diastasis recti (separation of superficial muscles), disk herniations, spinal fractures. These exercises set a solid foundation for you to later progress to resistance/ strength training with therabands and weights and return to other typical fitness exercises with a stronger deeper core.  Do level 2 (30 reps x 2 x day)    For balance:    Tree pose 5-10 breaths

## 2015-11-29 ENCOUNTER — Telehealth: Payer: Self-pay

## 2015-11-29 NOTE — Telephone Encounter (Signed)
Sounds like it will be okay to defer the appointment till tomorrow

## 2015-11-29 NOTE — Telephone Encounter (Signed)
I spoke with pt and her BP is going up and down; pt fell on 11/28/15 and bruised her face; offered pt appt today but pt has appt in [facility] this afternoon and will not return until late/ pt scheduled appt with Dr Silvio Pate 11/30/15 at 8 AM. If pt condition changes or worsens prior to appt pt was advised to go to [facility]. pt voiced understanding. FYI to Dr Silvio Pate.

## 2015-11-29 NOTE — Telephone Encounter (Signed)
PLEASE NOTE: All timestamps contained within this report are represented as [place] Standard Time. CONFIDENTIALTY NOTICE: This fax transmission is intended only for the addressee. It contains information that is legally privileged, confidential or otherwise protected from use or disclosure. If you are not the intended recipient, you are strictly prohibited from reviewing, disclosing, copying using or disseminating any of this information or taking any action in reliance on or regarding this information. If you have received this fax in error, please notify us immediately by telephone so that we can arrange for its return to [place]. Phone: [phone], Toll-Free: [phone], Fax: [phone] Page: 1 of 2 Call Id: [id number] [place] Patient Name: Katherine [name] Gender: Female DOB: 11-17-1950 Age: 65 Y 11 M 6 D Return Phone Number: [phone] (Primary), [phone] (Secondary) Address: City/State/Zip: [place] Client [place] Night - Client Client Site [place] Physician Viviana Simpler - MD Contact Type Call Who Is Calling Patient / Member / Family / Caregiver Call Type Triage / Clinical Caller Name Indira Relationship To Patient Self Return Phone Number [phone] (Primary) Chief Complaint Headache Reason for Call Symptomatic / Request for [place] states her BP (most recent 126/74 at 10:30 a.m., highest 164/93) has been higher than normal and having headaches. 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 Increased BP caused by going outside in heat and getting upset with family. Face is red. Not currently having headache, but having bad headache symptoms which she has never had. PreDisposition InappropriateToAsk Translation No Nurse Assessment Nurse: Richardson Landry, RN, Aldona Bar Date/Time (Eastern Time):  11/26/2015 5:03:29 PM Confirm and document reason for call. If symptomatic, describe symptoms. You must click the next button to save text entered. ---Caller states she has had the headaches long before she noticed her bp was up. Has sx for about a month, saw acupuncturist which attributed sx to heat. Caller states she has had headache where you touch your head and it feels like a sore. Caller told it was a long wait for a regular appt. Caller wants to have her liver status checked and her headaches, caller states she does not have one at this very moment. Has the patient traveled out of the country within the last 30 days? ---No Does the patient have any new or worsening symptoms? ---No Guidelines Guideline Title Affirmed Question Affirmed Notes Nurse Date/Time (Eastern Time) Headache [1] New headache AND [2] age > 56 Richardson Landry, RN, Aldona Bar 11/26/2015 5:06:38 PM Disp. Time Eilene Ghazi Time) Disposition Final User 11/26/2015 5:13:29 PM See Physician within 24 Hours Yes Richardson Landry, RN, Aldona Bar PLEASE NOTE: All timestamps contained within this report are represented as [place] Standard Time. CONFIDENTIALTY NOTICE: This fax transmission is intended only for the addressee. It contains information that is legally privileged, confidential or otherwise protected from use or disclosure. If you are not the intended recipient, you are strictly prohibited from reviewing, disclosing, copying using or disseminating any of this information or taking any action in reliance on or regarding this information. If you have received this fax in error, please notify us immediately by telephone so that we can arrange for its return to [place]. Phone: [phone], Toll-Free: [phone], Fax: [phone] Page: 2 of 2 Call Id: [id number] Caller Understands: Yes Disagree/Comply: Comply Care Advice Given Per Guideline SEE PHYSICIAN WITHIN 24 HOURS: REST: Lie down in a dark quiet place and relax until feeling better. LOCAL  COLD:  Apply a cold wet washcloth or cold pack to the forehead for 20 minutes CALL BACK IF: * Blurred vision or double vision occurs * Numbness or weakness of the face, arm or leg * Difficulty with speaking * You become worse. CARE ADVICE given per Headache (Adult) guideline. Comments User: Melba Coon, RN Date/Time Eilene Ghazi Time): 11/26/2015 5:08:44 PM Caller c/o extreme difficulty sleeping and stiff neck. Referrals [facility]

## 2015-11-30 ENCOUNTER — Encounter: Payer: Self-pay | Admitting: Internal Medicine

## 2015-11-30 ENCOUNTER — Ambulatory Visit (INDEPENDENT_AMBULATORY_CARE_PROVIDER_SITE_OTHER): Payer: PPO | Admitting: Internal Medicine

## 2015-11-30 VITALS — BP 122/60 | HR 72 | Temp 98.0°F | Wt 122.0 lb

## 2015-11-30 DIAGNOSIS — R030 Elevated blood-pressure reading, without diagnosis of hypertension: Secondary | ICD-10-CM | POA: Insufficient documentation

## 2015-11-30 DIAGNOSIS — W19XXXA Unspecified fall, initial encounter: Secondary | ICD-10-CM

## 2015-11-30 DIAGNOSIS — T1490 Injury, unspecified: Secondary | ICD-10-CM | POA: Diagnosis not present

## 2015-11-30 NOTE — Progress Notes (Signed)
Pre visit review using our clinic review tool, if applicable. No additional management support is needed unless otherwise documented below in the visit note. 

## 2015-11-30 NOTE — Assessment & Plan Note (Signed)
BP Readings from Last 3 Encounters:  11/30/15 122/60  09/01/14 100/60  10/30/13 (!) 94/56   Repeat after talking 138/74 on right Discussed that checking when overheated---expect elevated BP Generally runs on low side No Rx indicated

## 2015-11-30 NOTE — Assessment & Plan Note (Signed)
Facial and right wrist contusions No serious injury Truly accident--no syncope, etc

## 2015-11-30 NOTE — Progress Notes (Signed)
Subjective:    Patient ID: Katherine [name], female    DOB: 1950-11-17, 65 y.o.   MRN: [id number]  HPI Here due to fall-- 2 days Tripped over stool she was sitting on while working outside [place] face first onto concrete Bruised face and hurt right hand Needed help getting up Went into house--put ice on face  Series of high blood pressure readings 2 weeks ago 164/93, 109/80, 156/94, 144.81, 126/62, 131/62 Originally checked after being outside and felt bad--"face burning on fire" Uses wrist cuff Had head pain then---tender all over her head. Went to acupuncturist for this Acupuncturist felt it was due to changes in temperature (going from cold to hot) Got tight in chest and neck was bothering her Has wrist cuff  Current Outpatient Prescriptions on File Prior to Visit  Medication Sig Dispense Refill  . clonazePAM (KLONOPIN) 0.5 MG tablet Take 0.25 mg by mouth daily.     . cyanocobalamin (,VITAMIN B-12,) 1000 MCG/ML injection Inject 1,000 mcg into the muscle every 30 (thirty) days.    Marland Kitchen docusate sodium (COLACE) 100 MG capsule Take 100 mg by mouth daily.    Marland Kitchen FOLIC ACID PO Take 15 mg by mouth daily.    Marland Kitchen HYDROcodone-acetaminophen (NORCO/VICODIN) 5-325 MG per tablet Take 1 tablet by mouth as needed for pain.    . methadone (DOLOPHINE) 10 MG tablet Take 10 mg by mouth 4 (four) times daily.     Marland Kitchen PARoxetine (PAXIL) 10 MG tablet Take 5 mg by mouth daily.    . polyethylene glycol powder (GLYCOLAX/MIRALAX) powder MIX 17 GRAMS IN LIQUID AND DRINK DAILY. 527 g 10  . VOLTAREN 1 % GEL Apply 2 g topically 2 (two) times daily.     No current facility-administered medications on file prior to visit.     Allergies  Allergen Reactions  . Duloxetine Hcl Other (See Comments)    Other Reaction: drwsiness;unable to walk  . Sulfonamide Derivatives     Past Medical History:  Diagnosis Date  . Alcoholism ([place])   . Anxiety   . Arthritis    Rheumatoid   . Chronic back pain    followed by pain  clinic  . Common bile duct dilatation   . Depression   . Retention of urine, unspecified   . Scoliosis (and kyphoscoliosis), idiopathic     Past Surgical History:  Procedure Laterality Date  . ABDOMINAL HYSTERECTOMY    . APPENDECTOMY    . BACK SURGERY  10/04   Back surgery- lumbar decompression (01/2003)  . BREAST ENHANCEMENT SURGERY  1998  . 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 SHOULDER OPEN ROTATOR CUFF REPAIR  1994   left shoulder repair    Family History  Problem Relation Age of Onset  . Diabetes Mother   . Hypertension Father     Social History   Social History  . Marital status: Married    Spouse name: N/A  . Number of children: N/A  . Years of education: N/A   Occupational History  . disabled- worked part-time at [employer] at [place]  . Smoking status: Former Smoker    Types: Cigarettes    Quit date: 06/21/2010  . Smokeless tobacco: Never Used  . Alcohol use No  . Drug use: Unknown  . Sexual activity: Not on file   Other Topics Concern  . Not on file   Social History  Narrative  . No narrative on file    Review of Systems Appetite is fine Chronic sleep problems--related to the back pain    Objective:   Physical Exam  HENT:  Bruising along bridge of nose and above lip No oral lesions--teeth okay No sig maxillary or orbital tenderness   Eyes: EOM are normal.  Neck: Normal range of motion. No thyromegaly present.  Cardiovascular: Normal rate, regular rhythm and normal heart sounds.  Exam reveals no gallop.   No murmur heard. Pulmonary/Chest: Effort normal and breath sounds normal. No respiratory distress. She has no wheezes. She has no rales.  Musculoskeletal:  Right wrist with mild swelling and slight bruising Normal grip strength  No sig localized bony tenderness  Lymphadenopathy:    She has no cervical adenopathy.          Assessment & Plan:

## 2015-12-09 ENCOUNTER — Ambulatory Visit: Payer: PPO | Attending: Obstetrics and Gynecology | Admitting: Physical Therapy

## 2015-12-09 DIAGNOSIS — M6281 Muscle weakness (generalized): Secondary | ICD-10-CM | POA: Insufficient documentation

## 2015-12-09 DIAGNOSIS — M7989 Other specified soft tissue disorders: Secondary | ICD-10-CM | POA: Diagnosis present

## 2015-12-09 NOTE — Therapy (Addendum)
[place] MAIN [facility] SERVICES [address] [place], [place], [address] Phone: [phone]   Fax:  [phone]  Physical Therapy Treatment  Patient Details  Name: Katherine [name] MRN: [id number] Date of Birth: 17-Aug-1950 Referring Provider: Leafy Ro  Encounter Date: 12/09/2015      PT End of Session - 12/09/15 1007    Visit Number 6   Number of Visits 12   Date for PT Re-Evaluation 01/04/16   PT Start Time 0905   PT Stop Time 0945   PT Time Calculation (min) 40 min   Activity Tolerance Patient tolerated treatment well;No increased pain   Behavior During Therapy WFL for tasks assessed/performed      Past Medical History:  Diagnosis Date  . Alcoholism ([place])   . Anxiety   . Arthritis    Rheumatoid   . Chronic back pain    followed by pain clinic  . Common bile duct dilatation   . Depression   . Retention of urine, unspecified   . Scoliosis (and kyphoscoliosis), idiopathic     Past Surgical History:  Procedure Laterality Date  . ABDOMINAL HYSTERECTOMY    . APPENDECTOMY    . BACK SURGERY  10/04   Back surgery- lumbar decompression (01/2003)  . BREAST ENHANCEMENT SURGERY  1998  . 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 SHOULDER OPEN ROTATOR CUFF REPAIR  1994   left shoulder repair    There were no vitals filed for this visit.      Subjective Assessment - 12/09/15 0904    Subjective Pt reported she felt good leaving last session but the next day, she felt her L LBP (she points on her L PSIS area) to be sore to the point she could not walk.  A few days later, she felt face forward onto concrete stepping over a stool. She injured her face and knees with bruising and bleeding.  She sought her MD and acupuncturist for treatments. Currently she is feeling better except for headaches.  Today her back is not hurting.    Patient is accompained by: Family member  husband in waiting    Pertinent History Hobbies: husband does gardening, pt prunes and directs gardening plan. Pt does laundry. Enjoy cooking meals. Pt uses acupuncture for stress, pain, RA and "everything".    Limitations Sitting;Walking;Standing   How long can you sit comfortably? sit max 30 min   How long can you stand comfortably? stand 10 mins   How long can you walk comfortably? 20 mins    Patient Stated Goals get out some of the this pain to have a better personality at 1-2/10 tolerable level , have intimacy with husband, ironing clothes  "I want my life back"                      Adult Aquatic Therapy - 12/09/15 1003      Aquatic Therapy Subjective   Subjective Pt had no complaints. Pt reported enjoying the session and found it to be relaxing.         O: Pt entered the pool via steps and exited via ramp with single UE support on rail.  50 ft =1 lap  Exercises performed in 3'6" depth   ROM of all joints  Cued for trunk rotation with trunk/pelvic disoassociation 2 laps walking forward/ backward with shoulder ER~0-30 deg  Holding noodles at their ends  Floating on noodles under armpits in L-sit position  with leg kicks 30 sec (cycling), 30 sec (ER circles) , 30 sec (abd/dd) with 30 sec rest breaks     floating on noodles, support by PT:  _2 laps, Cued for smaller ROM UE/LE  abd/add  With deep core activation  _30 reps of deep core level 2 (single knee out in coordination with exhalation)   2 laps guided relaxation/ visualization, floating on noodles, support by PT. Guided transition to standing without straining back. Neck                   PT Long Term Goals - 11/24/15 1448      PT LONG TERM GOAL #1   Title Pt will decrease her ODI score from 64% to < 54% in order to return to ADLs.  (11/24/15: 44%)    Time 12   Period Weeks   Status Achieved     PT LONG TERM GOAL #2   Title Pt will decrease her score on PFDI from 60% to < 50% in order to restore urinary and  bowel function  (11/24/15 : 46%)    Time 12   Period Weeks   Status Achieved     PT LONG TERM GOAL #3   Title Pt will demo decrease in fingers width in abdominal separation from 3 to < 2 fingers in order to progress to deep core strengthening and to minimize low back pain   Time 12   Period Weeks   Status Achieved     PT LONG TERM GOAL #4   Title Pt will demo increase SLS time to > 25 sec bilaterally in order to progress to higher functioning exercises such as stair climbing.   Time 12   Period Weeks   Status On-going     PT LONG TERM GOAL #5   Title Pt will demo proper body mechanics with house hold chores and gardening in order participate in activaities with husband and at home   Time 12   Period Weeks   Status On-going               Plan - 12/09/15 1007    Clinical Impression Statement Pt reported she found her session to be relaxing. Pt shows good carry over with posture and grade movement approaches. Pt required review of HEP which she had demo'd incorrectly today and can explain the onset of her L LBP.   Exercises focused on deep core activation, ROM exercises, propioception for balance, and education of decreasing ROM to minimize L LBP.  Pt reported no LBP with exercises and cuing. Pt was monitored at the end of the session upon report of dizziness and was seated for 5 min at which pt stated she felt better. Suspect pt's dizziness was due to effortful breathing technique performed at the end of the session. Pt is advancing towards her goals with skilled PT.     Rehab Potential Good   Clinical Impairments Affecting Rehab Potential stress and anxiety,  RA, lumbar surgery   PT Frequency Other (comment)  once every 2 weeks    PT Duration 12 weeks   PT Treatment/Interventions ADLs/Self Care Home Management;Aquatic Therapy;Gait training;Moist Heat;Stair training;Functional mobility training;Therapeutic activities;Traction;Therapeutic exercise;Balance training;Neuromuscular  re-education;Manual lymph drainage;Manual techniques;Orthotic Fit/Training;Patient/family education;Scar mobilization;Passive range of motion;Dry needling;Energy conservation;Taping;Splinting   Consulted and Agree with Plan of Care Patient      Patient will benefit from skilled therapeutic intervention in order to improve the following  deficits and impairments:     Visit Diagnosis: Muscle weakness (generalized)  Other specified soft tissue disorders     Problem List Patient Active Problem List   Diagnosis Date Noted  . Fall with injury 11/30/2015  . Elevated blood pressure (not hypertension) 11/30/2015  . Chronic pain syndrome 09/01/2014  . Heart palpitations 10/30/2013  . Routine general medical examination at a health care facility 06/28/2012  . Arthritis of hand 02/11/2010  . CONSTIPATION 04/05/2009  . Episodic mood disorder ([place]) 07/21/2008  . SCOLIOSIS 07/21/2008    Jerl Mina ,PT, DPT, E-RYT  12/09/2015, 10:14 AM  [place] MAIN [facility] SERVICES [address] [place], [place], [address] Phone: [phone]   Fax:  [phone]  Name: Katherine [name] MRN: [id number] Date of Birth: [birth year]

## 2015-12-14 ENCOUNTER — Ambulatory Visit: Payer: PPO | Admitting: Physical Therapy

## 2015-12-15 ENCOUNTER — Ambulatory Visit: Payer: PPO | Admitting: Physical Therapy

## 2015-12-15 ENCOUNTER — Telehealth: Payer: Self-pay | Admitting: Physical Therapy

## 2015-12-15 NOTE — Telephone Encounter (Signed)
PT f/u with pt's husband call that pt is cancelling her appt this afternoon due to her back pain. PT spoke with pt for 15 min to listen to pt's status, provide encouragement and guidance with use of activity pacing to help pt manage her relapse of pain since the last visit in the pool. Pt voiced understanding and realization to not be impatient with herself, not overdo her activities that lead to debilitating pain. PT reminded pt to return to prior HEP and work in small steps from bed to standing to gradually build up her endurance.  Pt voiced understanding and expressed appreciation for the care. Pt agreed to return to her next appt in 2 weeks.

## 2015-12-15 NOTE — Telephone Encounter (Signed)
PT

## 2015-12-27 ENCOUNTER — Ambulatory Visit: Payer: PPO | Admitting: Physical Therapy

## 2015-12-27 DIAGNOSIS — M6281 Muscle weakness (generalized): Secondary | ICD-10-CM | POA: Diagnosis not present

## 2015-12-27 DIAGNOSIS — M7989 Other specified soft tissue disorders: Secondary | ICD-10-CM

## 2015-12-27 NOTE — Therapy (Signed)
[place] MAIN [facility] SERVICES [address] [place], [place], [address] Phone: [phone]   Fax:  [phone]  Physical Therapy Treatment  Patient Details  Name: Katherine [name] MRN: [id number] Date of Birth: 19-Dec-1950 Referring Provider: Leafy Ro  Encounter Date: 12/27/2015      PT End of Session - 12/27/15 1500    Visit Number 7   Number of Visits 12   Date for PT Re-Evaluation 01/04/16   PT Start Time [id number]   PT Stop Time 1500   PT Time Calculation (min) 45 min   Activity Tolerance Patient tolerated treatment well;No increased pain   Behavior During Therapy WFL for tasks assessed/performed      Past Medical History:  Diagnosis Date  . Alcoholism ([place])   . Anxiety   . Arthritis    Rheumatoid   . Chronic back pain    followed by pain clinic  . Common bile duct dilatation   . Depression   . Retention of urine, unspecified   . Scoliosis (and kyphoscoliosis), idiopathic     Past Surgical History:  Procedure Laterality Date  . ABDOMINAL HYSTERECTOMY    . APPENDECTOMY    . BACK SURGERY  10/04   Back surgery- lumbar decompression (01/2003)  . BREAST ENHANCEMENT SURGERY  1998  . 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 SHOULDER OPEN ROTATOR CUFF REPAIR  1994   left shoulder repair    There were no vitals filed for this visit.      Subjective Assessment - 12/27/15 1419    Subjective Pt reported the lesson PT shared with her over the phone has been a "big help". Pt has stopped "jumping out of the bed" which had caused more pain. Pt now relaxes and sits down between activities and "not push it" anymore. Pt has returned to walking 1 mile in the morning instead of 2 miles and added 1/2 mi into her evening. Pt states her back pain is located at L5/S area and her scoliois area has been better with the stretches.  Pt feels her knees are bothering her since she had her fall a month ago.       Patient is accompained by: Family member  husband in waiting    Pertinent History Hobbies: husband does gardening, pt prunes and directs gardening plan. Pt does laundry. Enjoy cooking meals. Pt uses acupuncture for stress, pain, RA and "everything".    Limitations Sitting;Walking;Standing   How long can you sit comfortably? sit max 30 min   How long can you stand comfortably? stand 10 mins   How long can you walk comfortably? 20 mins    Patient Stated Goals get out some of the this pain to have a better personality at 1-2/10 tolerable level , have intimacy with husband, ironing clothes  "I want my life back"                       Pelvic Floor Special Questions - 12/27/15 1502    Prolapse other bladder more cranial location without pillow following Tx   Pelvic Floor Internal Exam pt consented verbally without contraindications    Exam Type Vaginal   Palpation increased mm tensions along L ischiocavernosus, bulbospongiosis, and deep transverse perineal     Strength fair squeeze, definite lift           [facility] Adult PT Treatment/Exercise - 12/27/15 1503  Therapeutic Activites    Therapeutic Activities --  see pt instruction     Manual Therapy   Manual therapy comments external and internal releases of pelvic floor mm (L) see assessment)                 PT Education - 12/27/15 1458    Education provided Yes   Education Details HEP   Person(s) Educated Patient   Methods Explanation;Demonstration;Tactile cues;Verbal cues;Handout   Comprehension Verbalized understanding;Returned demonstration             PT Long Term Goals - 12/27/15 1501      PT LONG TERM GOAL #1   Title Pt will decrease her ODI score from 64% to < 54% in order to return to ADLs.  (11/24/15: 44%)    Time 12   Period Weeks   Status Achieved     PT LONG TERM GOAL #2   Title Pt will decrease her score on PFDI from 60% to < 50% in order to restore urinary and bowel function   (11/24/15 : 46%)    Time 12   Period Weeks   Status Achieved     PT LONG TERM GOAL #3   Title Pt will demo decrease in fingers width in abdominal separation from 3 to < 2 fingers in order to progress to deep core strengthening and to minimize low back pain   Time 12   Period Weeks   Status Achieved     PT LONG TERM GOAL #4   Title Pt will demo increase SLS time to > 25 sec bilaterally in order to progress to higher functioning exercises such as stair climbing.   Time 12   Period Weeks   Status On-going     PT LONG TERM GOAL #5   Title Pt will demo proper body mechanics with house hold chores and gardening in order participate in activaities with husband and at home   Time 12   Period Weeks   Status On-going               Plan - 12/27/15 1500    Clinical Impression Statement Pt reported no pain in the pelvis and back following pelvic Tx internally/externally. Pt reported referred pain to her  back during pelvic floor mm manual Tx and pain in her back at L5/S and midback resolved following Tx. Pt states she uses biopsychosocial approaches she has learned. Pt is close to achieving her goals.    Rehab Potential Good   Clinical Impairments Affecting Rehab Potential stress and anxiety,  RA, lumbar surgery   PT Frequency Other (comment)  once every 2 weeks    PT Duration 12 weeks   PT Treatment/Interventions ADLs/Self Care Home Management;Aquatic Therapy;Gait training;Moist Heat;Stair training;Functional mobility training;Therapeutic activities;Traction;Therapeutic exercise;Balance training;Neuromuscular re-education;Manual lymph drainage;Manual techniques;Orthotic Fit/Training;Patient/family education;Scar mobilization;Passive range of motion;Dry needling;Energy conservation;Taping;Splinting   Consulted and Agree with Plan of Care Patient      Patient will benefit from skilled therapeutic intervention in order to improve the following deficits and impairments:  Pain, Improper  body mechanics, Increased fascial restricitons, Increased muscle spasms, Decreased scar mobility, Decreased coordination, Decreased mobility, Decreased strength, Decreased safety awareness, Decreased balance, Decreased endurance, Decreased range of motion, Decreased activity tolerance  Visit Diagnosis: Muscle weakness (generalized)  Other specified soft tissue disorders     Problem List Patient Active Problem List   Diagnosis Date Noted  . Fall with injury 11/30/2015  . Elevated blood pressure (not hypertension) 11/30/2015  .  Chronic pain syndrome 09/01/2014  . Heart palpitations 10/30/2013  . Routine general medical examination at a health care facility 06/28/2012  . Arthritis of hand 02/11/2010  . CONSTIPATION 04/05/2009  . Episodic mood disorder ([place]) 07/21/2008  . SCOLIOSIS 07/21/2008    Jerl Mina ,PT, DPT, E-RYT  12/27/2015, 3:04 PM  [place] MAIN [facility] SERVICES [address] [place], [place], [address] Phone: [phone]   Fax:  [phone]  Name: Katherine [name] MRN: [id number] Date of Birth: [birth year]

## 2015-12-27 NOTE — Patient Instructions (Addendum)
Back lengthening stretches:  3 Ruis bend at the hips, head down At a counter between activities.      To decrease pelvic floor muscle tensions:  Happy baby pose with strap on bottom of feet  Rocking 10 reps    Child pose 5 breaths

## 2015-12-28 ENCOUNTER — Ambulatory Visit: Payer: PPO | Admitting: Physical Therapy

## 2016-01-04 ENCOUNTER — Ambulatory Visit: Payer: PPO | Attending: Obstetrics and Gynecology | Admitting: Physical Therapy

## 2016-01-06 ENCOUNTER — Ambulatory Visit: Payer: PPO

## 2016-01-11 ENCOUNTER — Ambulatory Visit: Payer: PPO | Admitting: Physical Therapy

## 2016-01-19 ENCOUNTER — Encounter: Payer: PPO | Admitting: Physical Therapy

## 2016-01-27 ENCOUNTER — Ambulatory Visit: Payer: PPO | Admitting: Physical Therapy

## 2016-02-02 ENCOUNTER — Encounter: Payer: PPO | Admitting: Physical Therapy

## 2016-02-10 ENCOUNTER — Ambulatory Visit: Payer: PPO | Admitting: Physical Therapy

## 2016-02-22 ENCOUNTER — Ambulatory Visit: Payer: PPO | Attending: Physical Medicine and Rehabilitation | Admitting: Physical Therapy

## 2016-04-12 ENCOUNTER — Encounter: Payer: PPO | Admitting: Internal Medicine

## 2016-04-27 ENCOUNTER — Ambulatory Visit: Payer: Medicare HMO | Attending: Physical Medicine and Rehabilitation | Admitting: Physical Therapy

## 2016-04-27 DIAGNOSIS — M4125 Other idiopathic scoliosis, thoracolumbar region: Secondary | ICD-10-CM

## 2016-04-27 DIAGNOSIS — M6281 Muscle weakness (generalized): Secondary | ICD-10-CM | POA: Diagnosis not present

## 2016-04-27 DIAGNOSIS — M546 Pain in thoracic spine: Secondary | ICD-10-CM

## 2016-04-27 NOTE — Patient Instructions (Addendum)
To release midback pain/ muscle tensions:  Yoga block (2") behind midback longways , a block under head   Reposition into lengthen spine , pelvic neutral  Allow knees to move ~10-15 deg so to allow your body weight to hang over edge of the block and back to center, then the other side   5 reps   __________    "W" exercise  10 reps x 2 sets     keep upper arm and elbow touching the bed the whole time  - inhale and then exhale by bending elbows hands move in a "w" , elbows don't move  (feel shoulder blades squeezing)    ___________  Pulling car door: L eft hand stabilizing on chair seat. Exhale with pulling door, using midback muscles

## 2016-04-28 NOTE — Therapy (Signed)
[place] MAIN [facility] SERVICES [address] [place], [place], [address] Phone: [phone]   Fax:  [phone]  Physical Therapy Treatment  Patient Details  Name: Katherine [name] MRN: [id number] Date of Birth: July 22, 1950 Referring Provider: Leafy Ro  Encounter Date: 04/27/2016      PT End of Session - 04/27/16 1523    Visit Number 8   Number of Visits 12   Date for PT Re-Evaluation 07/20/16   PT Start Time [id number]   PT Stop Time 1535   PT Time Calculation (min) 53 min   Activity Tolerance Patient tolerated treatment well;No increased pain   Behavior During Therapy WFL for tasks assessed/performed      Past Medical History:  Diagnosis Date  . Alcoholism ([place])   . Anxiety   . Arthritis    Rheumatoid   . Chronic back pain    followed by pain clinic  . Common bile duct dilatation   . Depression   . Retention of urine, unspecified   . Scoliosis (and kyphoscoliosis), idiopathic     Past Surgical History:  Procedure Laterality Date  . ABDOMINAL HYSTERECTOMY    . APPENDECTOMY    . BACK SURGERY  10/04   Back surgery- lumbar decompression (01/2003)  . BREAST ENHANCEMENT SURGERY  1998  . 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 SHOULDER OPEN ROTATOR CUFF REPAIR  1994   left shoulder repair    There were no vitals filed for this visit.      Subjective Assessment - 04/27/16 1459    Subjective Pt returns to PT after 4 months after a family death and sustaining a fall. Pt was with no pelvic pain across this period  but continues to have scoliosis-related pain. Pt had practiced her stretches to relieve mm tensions related to her curve but she is unsure how to perform one particular stretch .  Pt reports PT had worked on an area on the R midback in previous session and it improved. But this area has been a problem and interrupts her sleep.             [facility] PT Assessment - 04/28/16 1737       Palpation   Spinal mobility T 9-10 L ateral shift of vertebrae with  report of tenderness                      [facility] Adult PT Treatment/Exercise - 04/28/16 1738      Neuro Re-ed    Neuro Re-ed Details  see pt instructions     Manual Therapy   Manual therapy comments distraction , STM along lateral border of L vertebrae                 PT Education - 04/27/16 1523    Education provided Yes   Education Details HEP   Person(s) Educated Patient   Methods Explanation;Demonstration;Tactile cues;Verbal cues;Handout   Comprehension Returned demonstration;Verbalized understanding             PT Long Term Goals - 04/27/16 1526      PT LONG TERM GOAL #1   Title Pt will decrease her ODI score from 64% to < 54% in order to return to ADLs.  (11/24/15: 44%)    Time 12   Period Weeks   Status Achieved     PT LONG TERM GOAL #2   Title Pt  will decrease her score on PFDI from 60% to < 50% in order to restore urinary and bowel function  (11/24/15 : 46%)    Time 12   Period Weeks   Status Achieved     PT LONG TERM GOAL #3   Title Pt will demo decrease in fingers width in abdominal separation from 3 to < 2 fingers in order to progress to deep core strengthening and to minimize low back pain   Time 12   Period Weeks   Status Achieved     PT LONG TERM GOAL #4   Title Pt will demo increase SLS time to > 25 sec bilaterally in order to progress to higher functioning exercises such as stair climbing.   Time 12   Period Weeks   Status On-going     PT LONG TERM GOAL #5   Title Pt will demo proper body mechanics with house hold chores and gardening in order participate in activaities with husband and at home   Time 12   Period Weeks   Status On-going     Additional Long Term Goals   Additional Long Term Goals Yes     PT LONG TERM GOAL #6   Title Pt will be IND with scoliosis -related HEP to minimize pelvic and back pain in order to sleep.   Time 12   Period  Weeks   Status New     PT LONG TERM GOAL #7   Title Pt will demo no thoracic segment deviation and have no  tenderness at T9-10  across 2 visits in order to demo IND with self-management of scoliosis   Period Weeks   Status New               Plan - 04/27/16 1528    Clinical Impression Statement Pt returns to PT after 4 months due to a family death and having sustained a fall. Pt reports no more pelvic pain across the past months but continues to have issues related to her scolitic spine located to the R midthoracic area.  T9 vertebra was laterally shifted left with a report of referred pain to down her back. Following Tx today, pt reports the pain was relieved and she could stand better. Pt also demo'd improved shoulder mechanics with pulling and pushing.  Pt will continue to benefit from skilled PT as PT suspects her scoliotic issues were a cause to her pelvic and back issues.      Clinical Impairments Affecting Rehab Potential stress and anxiety,  RA, lumbar surgery   PT Frequency 1x / week   PT Duration 12 weeks   PT Treatment/Interventions ADLs/Self Care Home Management;Aquatic Therapy;Gait training;Moist Heat;Stair training;Functional mobility training;Therapeutic activities;Traction;Therapeutic exercise;Balance training;Neuromuscular re-education;Manual lymph drainage;Manual techniques;Orthotic Fit/Training;Patient/family education;Scar mobilization;Passive range of motion;Dry needling;Energy conservation;Taping;Splinting   Consulted and Agree with Plan of Care Patient      Patient will benefit from skilled therapeutic intervention in order to improve the following deficits and impairments:  Pain, Improper body mechanics, Increased fascial restricitons, Increased muscle spasms, Decreased scar mobility, Decreased coordination, Decreased mobility, Decreased strength, Decreased safety awareness, Decreased balance, Decreased endurance, Decreased range of motion, Decreased activity  tolerance  Visit Diagnosis: Muscle weakness (generalized)  Pain in thoracic spine  Other idiopathic scoliosis, thoracolumbar region     Problem List Patient Active Problem List   Diagnosis Date Noted  . Fall with injury 11/30/2015  . Elevated blood pressure (not hypertension) 11/30/2015  . Chronic pain syndrome 09/01/2014  . Heart  palpitations 10/30/2013  . Routine general medical examination at a health care facility 06/28/2012  . Arthritis of hand 02/11/2010  . CONSTIPATION 04/05/2009  . Episodic mood disorder ([place]) 07/21/2008  . SCOLIOSIS 07/21/2008    Jerl Mina 04/28/2016, 5:41 PM  [place] MAIN [facility] SERVICES [address] [place], [place], [address] Phone: [phone]   Fax:  [phone]  Name: Katherine [name] MRN: [id number] Date of Birth: [birth year]

## 2016-05-02 DIAGNOSIS — Z79891 Long term (current) use of opiate analgesic: Secondary | ICD-10-CM | POA: Insufficient documentation

## 2016-05-05 ENCOUNTER — Ambulatory Visit: Payer: Medicare HMO | Attending: Physical Medicine and Rehabilitation | Admitting: Physical Therapy

## 2016-05-05 DIAGNOSIS — M4125 Other idiopathic scoliosis, thoracolumbar region: Secondary | ICD-10-CM | POA: Diagnosis present

## 2016-05-05 DIAGNOSIS — M546 Pain in thoracic spine: Secondary | ICD-10-CM

## 2016-05-05 DIAGNOSIS — M6281 Muscle weakness (generalized): Secondary | ICD-10-CM

## 2016-05-05 NOTE — Therapy (Signed)
[place] MAIN [facility] SERVICES [address] [place], [place], [address] Phone: [phone]   Fax:  [phone]  Physical Therapy Treatment  Patient Details  Name: Katherine [name] MRN: [id number] Date of Birth: 05-Apr-1950 Referring Provider: Leafy Ro  Encounter Date: 05/05/2016      PT End of Session - 05/05/16 1416    Visit Number 9   Number of Visits 12   Date for PT Re-Evaluation 07/20/16   Authorization Type g-code   PT Start Time 8546   PT Stop Time 1505   PT Time Calculation (min) 50 min   Activity Tolerance Patient tolerated treatment well;No increased pain   Behavior During Therapy WFL for tasks assessed/performed      Past Medical History:  Diagnosis Date  . Alcoholism ([place])   . Anxiety   . Arthritis    Rheumatoid   . Chronic back pain    followed by pain clinic  . Common bile duct dilatation   . Depression   . Retention of urine, unspecified   . Scoliosis (and kyphoscoliosis), idiopathic     Past Surgical History:  Procedure Laterality Date  . ABDOMINAL HYSTERECTOMY    . APPENDECTOMY    . BACK SURGERY  10/04   Back surgery- lumbar decompression (01/2003)  . BREAST ENHANCEMENT SURGERY  1998  . 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 SHOULDER OPEN ROTATOR CUFF REPAIR  1994   left shoulder repair    There were no vitals filed for this visit.      Subjective Assessment - 05/05/16 1419    Subjective Pt reported she felt sore after last session. Pt was able to move her neck to the R for  the first time in a long time. Pt has started to get out of the house more with grocery shopping, attending music performances. Her housecleaning person stated she noticed the pt was happier than before the passing of her father. Pt would like to lose weight because she had not left the house due to depression.     Pertinent History Hobbies: husband does gardening, pt prunes and directs  gardening plan. Pt does laundry. Enjoy cooking meals. Pt uses acupuncture for stress, pain, RA and "everything".    Limitations Sitting;Walking;Standing            [facility] PT Assessment - 05/05/16 1450      AROM   Overall AROM Comments cervical SB R 25 deg, L 20 deg, rotation L 35 deg, R 40 deg    Increased R rotation in supine with shoulder depression                      [facility] Adult PT Treatment/Exercise - 05/05/16 1455      Neuro Re-ed    Neuro Re-ed Details  see pt instructions                PT Education - 05/05/16 1455    Education provided Yes   Education Details HEP   Person(s) Educated Patient   Methods Explanation;Demonstration;Tactile cues;Verbal cues;Handout   Comprehension Returned demonstration;Verbalized understanding             PT Long Term Goals - 05/05/16 1458      PT LONG TERM GOAL #1   Title Pt will decrease her ODI score from 64% to < 54% in order to return to ADLs.  (11/24/15: 44%)  Time 12   Period Weeks   Status Achieved     PT LONG TERM GOAL #2   Title Pt will decrease her score on PFDI from 60% to < 50% in order to restore urinary and bowel function  (11/24/15 : 46%)    Time 12   Period Weeks   Status Achieved     PT LONG TERM GOAL #3   Title Pt will demo decrease in fingers width in abdominal separation from 3 to < 2 fingers in order to progress to deep core strengthening and to minimize low back pain   Time 12   Period Weeks   Status Achieved     PT LONG TERM GOAL #4   Title Pt will demo increase SLS time to > 25 sec bilaterally in order to progress to higher functioning exercises such as stair climbing. (2/2: R : 21 sec, 17 sec L )    Time 12   Period Weeks   Status Partially Met     PT LONG TERM GOAL #5   Title Pt will demo proper body mechanics with house hold chores and gardening in order participate in activaities with husband and at home   Time 12   Period Weeks   Status On-going     Additional  Long Term Goals   Additional Long Term Goals Yes     PT LONG TERM GOAL #6   Title Pt will be IND with scoliosis -related HEP to minimize pelvic and back pain in order to sleep.   Time 12   Period Weeks   Status On-going     PT LONG TERM GOAL #7   Title Pt will demo no thoracic segment deviation and have no  tenderness at T9-10  across 2 visits in order to demo IND with self-management of scoliosis   Period Weeks   Status Partially Met     PT LONG TERM GOAL #8   Title Pt will demo increased cervical rotation to B > 35 deg and sidebend B > 35 deg in order to drive safely    Period Weeks   Status New               Plan - 05/05/16 1417    Clinical Impression Statement Pt returns with no deviations at her thoracic spine. Pt showed in creased R cervical rotation with increased shoulder depression. Pt will continue benefit from motor control training and review of previous HEP to further achieve her goals.  Pt was provided information on a community talk about depression offered by a [facility] health provider.     Clinical Impairments Affecting Rehab Potential stress and anxiety,  RA, lumbar surgery   PT Frequency 1x / week   PT Duration 12 weeks   PT Treatment/Interventions ADLs/Self Care Home Management;Aquatic Therapy;Gait training;Moist Heat;Stair training;Functional mobility training;Therapeutic activities;Traction;Therapeutic exercise;Balance training;Neuromuscular re-education;Manual lymph drainage;Manual techniques;Orthotic Fit/Training;Patient/family education;Scar mobilization;Passive range of motion;Dry needling;Energy conservation;Taping;Splinting   Consulted and Agree with Plan of Care Patient      Patient will benefit from skilled therapeutic intervention in order to improve the following deficits and impairments:  Pain, Improper body mechanics, Increased fascial restricitons, Increased muscle spasms, Decreased scar mobility, Decreased coordination, Decreased mobility,  Decreased strength, Decreased safety awareness, Decreased balance, Decreased endurance, Decreased range of motion, Decreased activity tolerance  Visit Diagnosis: Muscle weakness (generalized)  Pain in thoracic spine  Other idiopathic scoliosis, thoracolumbar region     Problem List Patient Active Problem List   Diagnosis Date  Noted  . Fall with injury 11/30/2015  . Elevated blood pressure (not hypertension) 11/30/2015  . Chronic pain syndrome 09/01/2014  . Heart palpitations 10/30/2013  . Routine general medical examination at a health care facility 06/28/2012  . Arthritis of hand 02/11/2010  . CONSTIPATION 04/05/2009  . Episodic mood disorder ([place]) 07/21/2008  . SCOLIOSIS 07/21/2008    Jerl Mina ,PT, DPT, E-RYT  05/05/2016, 3:07 PM  [place] MAIN [facility] SERVICES [address] [place], [place], [address] Phone: [phone]   Fax:  [phone]  Name: Katherine [name] MRN: [id number] Date of Birth: 04-22-1950

## 2016-05-05 NOTE — Patient Instructions (Signed)
Stretches:  Prior to and after walking  Figure -4    Knees to chest     Cat-cow     Ardine Eng pose     Strengthening: [place]ust pose/ "Rocket woman"   On belly, pressing pubic bone down Fingers shooting out towards feet, palms face hips   Inhale  Exhale, belly hugs  Lift chest up , bending only at midback, keep chin down as if "holding an orange" Shoulders move downward, Fingers shooting out towards feet, palms face hips    5 reps x 3 x day    _______   Deep core level 2 (knee out )  L and R =1 rep , finger count

## 2016-06-01 ENCOUNTER — Telehealth: Payer: Self-pay | Admitting: Internal Medicine

## 2016-06-01 NOTE — Telephone Encounter (Signed)
LVM for pt to call back and schedule AWV/CPE with PCP.

## 2016-06-06 ENCOUNTER — Ambulatory Visit: Payer: Medicare HMO | Admitting: Physical Therapy

## 2016-06-26 NOTE — Telephone Encounter (Signed)
Spoke to house hold member. He will have pt call back when she wakes up

## 2016-07-11 ENCOUNTER — Encounter: Payer: Medicare HMO | Admitting: Physical Therapy

## 2017-01-29 ENCOUNTER — Observation Stay
Admission: EM | Admit: 2017-01-29 | Discharge: 2017-01-31 | Disposition: A | Discharge status: Home / Self Care | Payer: Medicare HMO | Attending: Surgery | Admitting: Surgery

## 2017-01-29 ENCOUNTER — Emergency Department: Payer: Medicare HMO

## 2017-01-29 DIAGNOSIS — Y9301 Activity, walking, marching and hiking: Secondary | ICD-10-CM | POA: Insufficient documentation

## 2017-01-29 DIAGNOSIS — S52292A Other fracture of shaft of left ulna, initial encounter for closed fracture: Secondary | ICD-10-CM

## 2017-01-29 DIAGNOSIS — M069 Rheumatoid arthritis, unspecified: Secondary | ICD-10-CM | POA: Diagnosis not present

## 2017-01-29 DIAGNOSIS — Y92480 Sidewalk as the place of occurrence of the external cause: Secondary | ICD-10-CM | POA: Insufficient documentation

## 2017-01-29 DIAGNOSIS — F329 Major depressive disorder, single episode, unspecified: Secondary | ICD-10-CM | POA: Diagnosis not present

## 2017-01-29 DIAGNOSIS — F10229 Alcohol dependence with intoxication, unspecified: Secondary | ICD-10-CM | POA: Diagnosis not present

## 2017-01-29 DIAGNOSIS — Z79899 Other long term (current) drug therapy: Secondary | ICD-10-CM | POA: Insufficient documentation

## 2017-01-29 DIAGNOSIS — S5290XA Unspecified fracture of unspecified forearm, initial encounter for closed fracture: Secondary | ICD-10-CM

## 2017-01-29 DIAGNOSIS — S52302A Unspecified fracture of shaft of left radius, initial encounter for closed fracture: Secondary | ICD-10-CM | POA: Diagnosis not present

## 2017-01-29 DIAGNOSIS — Y904 Blood alcohol level of 80-99 mg/100 ml: Secondary | ICD-10-CM | POA: Insufficient documentation

## 2017-01-29 DIAGNOSIS — F419 Anxiety disorder, unspecified: Secondary | ICD-10-CM | POA: Insufficient documentation

## 2017-01-29 DIAGNOSIS — S52202A Unspecified fracture of shaft of left ulna, initial encounter for closed fracture: Secondary | ICD-10-CM | POA: Insufficient documentation

## 2017-01-29 LAB — ETHANOL: ALCOHOL ETHYL (B): 86 mg/dL — AB (ref <10)

## 2017-01-29 LAB — COMPREHENSIVE METABOLIC PANEL
ALBUMIN: 4.4 g/dL (ref 3.5–5.0)
ALK PHOS: 44 U/L (ref 38–126)
ALT: 26 U/L (ref 14–54)
ANION GAP: 12 (ref 5–15)
AST: 44 U/L — AB (ref 15–41)
BILIRUBIN TOTAL: 0.8 mg/dL (ref 0.3–1.2)
BUN: 10 mg/dL (ref 6–20)
CO2: 23 mmol/L (ref 22–32)
Calcium: 9 mg/dL (ref 8.9–10.3)
Chloride: 103 mmol/L (ref 101–111)
Creatinine, Ser: 0.72 mg/dL (ref 0.44–1.00)
GFR calc Af Amer: >60 mL/min (ref >60)
GFR calc non Af Amer: >60 mL/min (ref >60)
GLUCOSE: 102 mg/dL — AB (ref 65–99)
POTASSIUM: 4.7 mmol/L (ref 3.5–5.1)
SODIUM: 138 mmol/L (ref 135–145)
TOTAL PROTEIN: 7.8 g/dL (ref 6.5–8.1)

## 2017-01-29 LAB — PROTIME-INR
INR: 0.96
Prothrombin Time: 12.7 seconds (ref 11.4–15.2)

## 2017-01-29 LAB — CBC WITH DIFFERENTIAL/PLATELET
BASOS ABS: 0.1 10*3/uL (ref 0–0.1)
Basophils Relative: 2 %
EOS PCT: 3 %
Eosinophils Absolute: 0.2 10*3/uL (ref 0–0.7)
HEMATOCRIT: 40.7 % (ref 35.0–47.0)
Hemoglobin: 13.5 g/dL (ref 12.0–16.0)
LYMPHS PCT: 31 %
Lymphs Abs: 1.5 10*3/uL (ref 1.0–3.6)
MCH: 30.3 pg (ref 26.0–34.0)
MCHC: 33.2 g/dL (ref 32.0–36.0)
MCV: 91.3 fL (ref 80.0–100.0)
MONO ABS: 0.4 10*3/uL (ref 0.2–0.9)
MONOS PCT: 9 %
NEUTROS ABS: 2.8 10*3/uL (ref 1.4–6.5)
Neutrophils Relative %: 55 %
PLATELETS: 234 10*3/uL (ref 150–440)
RBC: 4.45 MIL/uL (ref 3.80–5.20)
RDW: 14.1 % (ref 11.5–14.5)
WBC: 5.1 10*3/uL (ref 3.6–11.0)

## 2017-01-29 MED ORDER — DEXTROSE 5 % IV SOLN
2.0000 g | Freq: Once | INTRAVENOUS | Status: DC
  Filled 2017-01-29: qty 20

## 2017-01-29 MED ORDER — SODIUM CHLORIDE 0.9 % IV BOLUS (SEPSIS)
1000.0000 mL | Freq: Once | INTRAVENOUS | Status: AC
  Administered 2017-01-29: 1000 mL via INTRAVENOUS

## 2017-01-29 MED ORDER — MORPHINE SULFATE (PF) 4 MG/ML IV SOLN
4.0000 mg | Freq: Once | INTRAVENOUS | Status: AC
  Administered 2017-01-29: 4 mg via INTRAVENOUS
  Filled 2017-01-29: qty 1

## 2017-01-29 MED ORDER — HALOPERIDOL LACTATE 5 MG/ML IJ SOLN
2.5000 mg | Freq: Once | INTRAMUSCULAR | Status: AC
  Administered 2017-01-29: 2.5 mg via INTRAVENOUS
  Filled 2017-01-29: qty 1

## 2017-01-29 MED ORDER — HALOPERIDOL LACTATE 5 MG/ML IJ SOLN
2.5000 mg | Freq: Once | INTRAMUSCULAR | Status: AC
Start: 2017-01-29 — End: 2017-01-29
  Administered 2017-01-29: 2.5 mg via INTRAVENOUS

## 2017-01-29 MED ORDER — BUPIVACAINE HCL 0.5 % IJ SOLN
50.0000 mL | Freq: Once | INTRAMUSCULAR | Status: DC
  Filled 2017-01-29 (×2): qty 50

## 2017-01-29 MED ORDER — BUPIVACAINE HCL (PF) 0.5 % IJ SOLN: 50.0000 mL | Freq: Once | INTRAMUSCULAR | Status: DC

## 2017-01-29 MED ORDER — FENTANYL CITRATE (PF) 100 MCG/2ML IJ SOLN
50.0000 ug | Freq: Once | INTRAMUSCULAR | Status: AC
  Administered 2017-01-29: 50 ug via INTRAVENOUS
  Filled 2017-01-29: qty 2

## 2017-01-29 NOTE — ED Notes (Signed)
Ancef for intra op OR tomorrow

## 2017-01-29 NOTE — ED Notes (Signed)
Assisted patient on to the bedpan

## 2017-01-29 NOTE — ED Notes (Signed)
Patient stated that she could not void using the bedpan. External Catheter applied to help patient void.
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
## 2017-01-30 ENCOUNTER — Inpatient Hospital Stay: Payer: Medicare HMO

## 2017-01-30 ENCOUNTER — Encounter
Admission: EM | Disposition: A | Discharge status: Home / Self Care | Payer: Self-pay | Source: Home / Self Care | Attending: Emergency Medicine

## 2017-01-30 ENCOUNTER — Inpatient Hospital Stay: Payer: Medicare HMO | Admitting: Anesthesiology

## 2017-01-30 HISTORY — PX: ORIF RADIAL FRACTURE: SHX5113

## 2017-01-30 LAB — TYPE AND SCREEN
ABO/RH(D): B POS
ANTIBODY SCREEN: NEGATIVE

## 2017-01-30 LAB — SURGICAL PCR SCREEN
MRSA, PCR: NEGATIVE
Staphylococcus aureus: NEGATIVE

## 2017-01-30 SURGERY — OPEN REDUCTION INTERNAL FIXATION (ORIF) RADIAL FRACTURE
Anesthesia: General | Site: Arm Lower | Laterality: Left | Wound class: Clean

## 2017-01-30 MED ORDER — DOCUSATE SODIUM 100 MG PO CAPS
100.0000 mg | ORAL_CAPSULE | Freq: Two times a day (BID) | ORAL | Status: DC
  Administered 2017-01-30 – 2017-01-31 (×2): 100 mg via ORAL
  Filled 2017-01-30 (×2): qty 1

## 2017-01-30 MED ORDER — HYDROMORPHONE HCL 1 MG/ML IJ SOLN
INTRAMUSCULAR | Status: AC
  Administered 2017-01-30: 0.25 mg via INTRAVENOUS
  Filled 2017-01-30: qty 1

## 2017-01-30 MED ORDER — NEOMYCIN-POLYMYXIN B GU 40-200000 IR SOLN
Status: DC | PRN
  Administered 2017-01-30: 2 mL

## 2017-01-30 MED ORDER — ENOXAPARIN SODIUM 40 MG/0.4ML ~~LOC~~ SOLN
40.0000 mg | SUBCUTANEOUS | Status: DC
  Administered 2017-01-31: 40 mg via SUBCUTANEOUS
  Filled 2017-01-30: qty 0.4

## 2017-01-30 MED ORDER — ONDANSETRON 4 MG PO TBDP
4.0000 mg | ORAL_TABLET | Freq: Four times a day (QID) | ORAL | Status: DC | PRN
Start: 2017-01-30 — End: 2017-01-30

## 2017-01-30 MED ORDER — MAGNESIUM HYDROXIDE 400 MG/5ML PO SUSP: 30.0000 mL | Freq: Every day | ORAL | Status: DC | PRN

## 2017-01-30 MED ORDER — METHADONE HCL 10 MG PO TABS
20.0000 mg | ORAL_TABLET | Freq: Two times a day (BID) | ORAL | Status: DC
  Administered 2017-01-30 – 2017-01-31 (×2): 20 mg via ORAL
  Filled 2017-01-30 (×2): qty 2

## 2017-01-30 MED ORDER — VITAMIN D (ERGOCALCIFEROL) 1.25 MG (50000 UNIT) PO CAPS
50000.0000 [IU] | ORAL_CAPSULE | ORAL | Status: DC
  Filled 2017-01-30: qty 1

## 2017-01-30 MED ORDER — ACETAMINOPHEN 500 MG PO TABS
1000.0000 mg | ORAL_TABLET | Freq: Four times a day (QID) | ORAL | Status: DC
  Administered 2017-01-31 (×3): 1000 mg via ORAL
  Filled 2017-01-30 (×3): qty 2

## 2017-01-30 MED ORDER — METOCLOPRAMIDE HCL 10 MG PO TABS: 5.0000 mg | ORAL_TABLET | Freq: Three times a day (TID) | ORAL | Status: DC | PRN

## 2017-01-30 MED ORDER — MIDAZOLAM HCL 2 MG/2ML IJ SOLN
INTRAMUSCULAR | Status: DC | PRN
  Administered 2017-01-30: 2 mg via INTRAVENOUS

## 2017-01-30 MED ORDER — OXYCODONE HCL 5 MG PO TABS
5.0000 mg | ORAL_TABLET | ORAL | Status: DC | PRN
  Administered 2017-01-30 (×2): 10 mg via ORAL
  Filled 2017-01-30 (×2): qty 2

## 2017-01-30 MED ORDER — FLUTICASONE PROPIONATE 50 MCG/ACT NA SUSP
1.0000 | Freq: Every day | NASAL | Status: DC
  Filled 2017-01-30: qty 16

## 2017-01-30 MED ORDER — FENTANYL CITRATE (PF) 100 MCG/2ML IJ SOLN
INTRAMUSCULAR | Status: AC
  Filled 2017-01-30: qty 2

## 2017-01-30 MED ORDER — ACETAMINOPHEN 325 MG PO TABS: 650.0000 mg | ORAL_TABLET | ORAL | Status: DC | PRN

## 2017-01-30 MED ORDER — HYDROMORPHONE HCL 1 MG/ML IJ SOLN
INTRAMUSCULAR | Status: DC | PRN
  Administered 2017-01-30 (×2): 0.5 mg via INTRAVENOUS

## 2017-01-30 MED ORDER — VITAMIN D 1000 UNITS PO TABS
2000.0000 [IU] | ORAL_TABLET | Freq: Every day | ORAL | Status: DC
  Administered 2017-01-30 – 2017-01-31 (×2): 2000 [IU] via ORAL
  Filled 2017-01-30 (×2): qty 2

## 2017-01-30 MED ORDER — DIPHENHYDRAMINE HCL 12.5 MG/5ML PO ELIX: 12.5000 mg | ORAL_SOLUTION | ORAL | Status: DC | PRN

## 2017-01-30 MED ORDER — KETOROLAC TROMETHAMINE 15 MG/ML IJ SOLN
INTRAMUSCULAR | Status: AC
  Filled 2017-01-30: qty 2

## 2017-01-30 MED ORDER — DEXTROSE 5 % IV SOLN
2.0000 g | Freq: Four times a day (QID) | INTRAVENOUS | Status: AC
  Administered 2017-01-30 – 2017-01-31 (×3): 2 g via INTRAVENOUS
  Filled 2017-01-30 (×3): qty 2000

## 2017-01-30 MED ORDER — ONDANSETRON HCL 4 MG/2ML IJ SOLN: 4.0000 mg | Freq: Four times a day (QID) | INTRAMUSCULAR | Status: DC | PRN

## 2017-01-30 MED ORDER — CEFAZOLIN SODIUM-DEXTROSE 2-4 GM/100ML-% IV SOLN
2.0000 g | Freq: Four times a day (QID) | INTRAVENOUS | Status: DC
  Filled 2017-01-30 (×3): qty 100

## 2017-01-30 MED ORDER — HYDROMORPHONE HCL 1 MG/ML IJ SOLN
0.2500 mg | INTRAMUSCULAR | Status: DC | PRN
  Administered 2017-01-30 (×2): 0.25 mg via INTRAVENOUS
  Administered 2017-01-30: 0.5 mg via INTRAVENOUS

## 2017-01-30 MED ORDER — HYDROMORPHONE HCL 1 MG/ML IJ SOLN
1.0000 mg | INTRAMUSCULAR | Status: DC | PRN
  Administered 2017-01-30 (×3): 1 mg via INTRAVENOUS
  Filled 2017-01-30: qty 1
  Filled 2017-01-30: qty 2
  Filled 2017-01-30: qty 1

## 2017-01-30 MED ORDER — CEFAZOLIN SODIUM-DEXTROSE 2-3 GM-%(50ML) IV SOLR
INTRAVENOUS | Status: DC | PRN
  Administered 2017-01-30: 2 g via INTRAVENOUS

## 2017-01-30 MED ORDER — BISACODYL 10 MG RE SUPP: 10.0000 mg | Freq: Every day | RECTAL | Status: DC | PRN

## 2017-01-30 MED ORDER — DEXAMETHASONE SODIUM PHOSPHATE 10 MG/ML IJ SOLN
INTRAMUSCULAR | Status: DC | PRN
  Administered 2017-01-30: 10 mg via INTRAVENOUS

## 2017-01-30 MED ORDER — KCL IN DEXTROSE-NACL 20-5-0.9 MEQ/L-%-% IV SOLN
INTRAVENOUS | Status: DC
  Administered 2017-01-30 (×2): via INTRAVENOUS
  Filled 2017-01-30 (×3): qty 1000

## 2017-01-30 MED ORDER — MEPERIDINE HCL 50 MG/ML IJ SOLN: 6.2500 mg | INTRAMUSCULAR | Status: DC | PRN

## 2017-01-30 MED ORDER — FLEET ENEMA 7-19 GM/118ML RE ENEM
1.0000 | ENEMA | Freq: Once | RECTAL | Status: DC | PRN
Start: 2017-01-30 — End: 2017-01-30

## 2017-01-30 MED ORDER — PANTOPRAZOLE SODIUM 40 MG IV SOLR: 40.0000 mg | Freq: Every day | INTRAVENOUS | Status: DC

## 2017-01-30 MED ORDER — HYDROMORPHONE HCL 1 MG/ML IJ SOLN
INTRAMUSCULAR | Status: AC
  Filled 2017-01-30: qty 1

## 2017-01-30 MED ORDER — PROMETHAZINE HCL 25 MG/ML IJ SOLN: 6.2500 mg | INTRAMUSCULAR | Status: DC | PRN

## 2017-01-30 MED ORDER — MIDAZOLAM HCL 2 MG/2ML IJ SOLN
INTRAMUSCULAR | Status: AC
  Filled 2017-01-30: qty 2

## 2017-01-30 MED ORDER — OXYCODONE HCL 5 MG PO TABS: 5.0000 mg | ORAL_TABLET | ORAL | Status: DC | PRN

## 2017-01-30 MED ORDER — OXYCODONE HCL 5 MG PO TABS: 5.0000 mg | ORAL_TABLET | Freq: Once | ORAL | Status: DC | PRN

## 2017-01-30 MED ORDER — CLONAZEPAM 0.5 MG PO TABS
0.2500 mg | ORAL_TABLET | Freq: Two times a day (BID) | ORAL | Status: DC
  Administered 2017-01-30 – 2017-01-31 (×2): 0.25 mg via ORAL
  Filled 2017-01-30 (×2): qty 1

## 2017-01-30 MED ORDER — DEXAMETHASONE SODIUM PHOSPHATE 10 MG/ML IJ SOLN
INTRAMUSCULAR | Status: AC
  Filled 2017-01-30: qty 1

## 2017-01-30 MED ORDER — METOCLOPRAMIDE HCL 5 MG/ML IJ SOLN: 5.0000 mg | Freq: Three times a day (TID) | INTRAMUSCULAR | Status: DC | PRN

## 2017-01-30 MED ORDER — LIDOCAINE HCL (PF) 2 % IJ SOLN
INTRAMUSCULAR | Status: AC
  Filled 2017-01-30: qty 10

## 2017-01-30 MED ORDER — PROPOFOL 10 MG/ML IV BOLUS
INTRAVENOUS | Status: AC
  Filled 2017-01-30: qty 20

## 2017-01-30 MED ORDER — KCL IN DEXTROSE-NACL 20-5-0.9 MEQ/L-%-% IV SOLN
INTRAVENOUS | Status: DC
  Administered 2017-01-30 – 2017-01-31 (×2): via INTRAVENOUS
  Filled 2017-01-30 (×4): qty 1000

## 2017-01-30 MED ORDER — BUPIVACAINE HCL (PF) 0.5 % IJ SOLN
INTRAMUSCULAR | Status: DC | PRN
  Administered 2017-01-30: 30 mL

## 2017-01-30 MED ORDER — METHADONE HCL 10 MG PO TABS
10.0000 mg | ORAL_TABLET | Freq: Every day | ORAL | Status: DC | PRN
  Administered 2017-01-30: 10 mg via ORAL
  Filled 2017-01-30: qty 1

## 2017-01-30 MED ORDER — LIDOCAINE HCL (CARDIAC) 20 MG/ML IV SOLN
INTRAVENOUS | Status: DC | PRN
  Administered 2017-01-30: 30 mg via INTRAVENOUS

## 2017-01-30 MED ORDER — ACETAMINOPHEN 650 MG RE SUPP: 650.0000 mg | RECTAL | Status: DC | PRN

## 2017-01-30 MED ORDER — FOLIC ACID 1 MG PO TABS
15.0000 mg | ORAL_TABLET | Freq: Every day | ORAL | Status: DC
  Administered 2017-01-30: 15 mg via ORAL
  Filled 2017-01-30 (×2): qty 15

## 2017-01-30 MED ORDER — FENTANYL CITRATE (PF) 100 MCG/2ML IJ SOLN
25.0000 ug | INTRAMUSCULAR | Status: DC | PRN
  Administered 2017-01-30 (×2): 50 ug via INTRAVENOUS

## 2017-01-30 MED ORDER — HYDROMORPHONE HCL 1 MG/ML IJ SOLN
1.0000 mg | INTRAMUSCULAR | Status: DC | PRN
  Administered 2017-01-30 – 2017-01-31 (×3): 2 mg via INTRAVENOUS
  Filled 2017-01-30 (×3): qty 2

## 2017-01-30 MED ORDER — OXYCODONE HCL 5 MG PO TABS: 10.0000 mg | ORAL_TABLET | ORAL | Status: DC | PRN

## 2017-01-30 MED ORDER — DOCUSATE SODIUM 100 MG PO CAPS: 100.0000 mg | ORAL_CAPSULE | Freq: Two times a day (BID) | ORAL | Status: DC

## 2017-01-30 MED ORDER — FLEET ENEMA 7-19 GM/118ML RE ENEM: 1.0000 | ENEMA | Freq: Once | RECTAL | Status: DC | PRN

## 2017-01-30 MED ORDER — FENTANYL CITRATE (PF) 100 MCG/2ML IJ SOLN
INTRAMUSCULAR | Status: DC | PRN
  Administered 2017-01-30 (×2): 50 ug via INTRAVENOUS

## 2017-01-30 MED ORDER — ONDANSETRON HCL 4 MG/2ML IJ SOLN
INTRAMUSCULAR | Status: DC | PRN
  Administered 2017-01-30: 4 mg via INTRAVENOUS

## 2017-01-30 MED ORDER — ACETAMINOPHEN 325 MG PO TABS: 650.0000 mg | ORAL_TABLET | Freq: Four times a day (QID) | ORAL | Status: DC | PRN

## 2017-01-30 MED ORDER — KETOROLAC TROMETHAMINE 15 MG/ML IJ SOLN
15.0000 mg | Freq: Four times a day (QID) | INTRAMUSCULAR | Status: DC
  Administered 2017-01-31 (×3): 15 mg via INTRAVENOUS
  Filled 2017-01-30 (×3): qty 1

## 2017-01-30 MED ORDER — KETOROLAC TROMETHAMINE 15 MG/ML IJ SOLN
30.0000 mg | Freq: Once | INTRAMUSCULAR | Status: AC
  Administered 2017-01-30: 30 mg via INTRAVENOUS

## 2017-01-30 MED ORDER — ACETAMINOPHEN 650 MG RE SUPP: 650.0000 mg | Freq: Four times a day (QID) | RECTAL | Status: DC | PRN

## 2017-01-30 MED ORDER — PANTOPRAZOLE SODIUM 40 MG PO TBEC
40.0000 mg | DELAYED_RELEASE_TABLET | Freq: Every day | ORAL | Status: DC
  Administered 2017-01-30 – 2017-01-31 (×2): 40 mg via ORAL
  Filled 2017-01-30 (×3): qty 1

## 2017-01-30 MED ORDER — POLYETHYLENE GLYCOL 3350 17 GM/SCOOP PO POWD
1.0000 | Freq: Every day | ORAL | Status: DC | PRN
  Filled 2017-01-30: qty 255

## 2017-01-30 MED ORDER — VILAZODONE HCL 20 MG PO TABS
20.0000 mg | ORAL_TABLET | Freq: Every day | ORAL | Status: DC
Start: 2017-01-30 — End: 2017-01-31
  Administered 2017-01-30 – 2017-01-31 (×2): 20 mg via ORAL
  Filled 2017-01-30 (×2): qty 1

## 2017-01-30 MED ORDER — FENTANYL CITRATE (PF) 100 MCG/2ML IJ SOLN
INTRAMUSCULAR | Status: AC
Start: 2017-01-30 — End: 2017-01-30
  Administered 2017-01-30: 50 ug via INTRAVENOUS
  Filled 2017-01-30: qty 2

## 2017-01-30 MED ORDER — ONDANSETRON HCL 4 MG/2ML IJ SOLN
INTRAMUSCULAR | Status: AC
  Filled 2017-01-30: qty 2

## 2017-01-30 MED ORDER — LACTATED RINGERS IV SOLN
INTRAVENOUS | Status: DC | PRN
  Administered 2017-01-30: 17:00:00 via INTRAVENOUS

## 2017-01-30 MED ORDER — OXYCODONE HCL 5 MG/5ML PO SOLN: 5.0000 mg | Freq: Once | ORAL | Status: DC | PRN

## 2017-01-30 MED ORDER — CYANOCOBALAMIN 1000 MCG/ML IJ SOLN
1000.0000 ug | INTRAMUSCULAR | Status: DC
  Administered 2017-01-31: 1000 ug via INTRAMUSCULAR
  Filled 2017-01-30: qty 1

## 2017-01-30 MED ORDER — ONDANSETRON HCL 4 MG PO TABS: 4.0000 mg | ORAL_TABLET | Freq: Four times a day (QID) | ORAL | Status: DC | PRN

## 2017-01-30 MED ORDER — PROPOFOL 10 MG/ML IV BOLUS
INTRAVENOUS | Status: DC | PRN
  Administered 2017-01-30: 120 mg via INTRAVENOUS

## 2017-01-30 SURGICAL SUPPLY — 52 items
APL SKNCLS STERI-STRIP NONHPOA (GAUZE/BANDAGES/DRESSINGS) ×1
BENZOIN TINCTURE PRP APPL 2/3 (GAUZE/BANDAGES/DRESSINGS) ×1 IMPLANT
BIT DRILL 2.5X2.75 QC CALB (BIT) ×1 IMPLANT
BNDG COHESIVE 4X5 TAN STRL (GAUZE/BANDAGES/DRESSINGS) ×2 IMPLANT
BNDG ESMARK 4X12 TAN STRL LF (GAUZE/BANDAGES/DRESSINGS) ×2 IMPLANT
CANISTER SUCT 1200ML W/VALVE (MISCELLANEOUS) ×2 IMPLANT
CHLORAPREP W/TINT 26ML (MISCELLANEOUS) ×4 IMPLANT
CORD BIP STRL DISP 12FT (MISCELLANEOUS) ×2 IMPLANT
CUFF TOURN 18 STER (MISCELLANEOUS) ×1 IMPLANT
DRAPE FLUOR MINI C-ARM 54X84 (DRAPES) ×2 IMPLANT
DRAPE IMP U-DRAPE 54X76 (DRAPES) ×2 IMPLANT
DRAPE SURG 17X11 SM STRL (DRAPES) ×2 IMPLANT
DRAPE U-SHAPE 47X51 STRL (DRAPES) ×2 IMPLANT
ELECT CAUTERY BLADE 6.4 (BLADE) ×2 IMPLANT
ELECT REM PT RETURN 9FT ADLT (ELECTROSURGICAL) ×2
ELECTRODE REM PT RTRN 9FT ADLT (ELECTROSURGICAL) ×1 IMPLANT
FORCEPS JEWEL BIP 4-3/4 STR (INSTRUMENTS) ×2 IMPLANT
GAUZE PETRO XEROFOAM 1X8 (MISCELLANEOUS) ×3 IMPLANT
GAUZE SPONGE 4X4 12PLY STRL (GAUZE/BANDAGES/DRESSINGS) ×2 IMPLANT
GAUZE XEROFORM 4X4 STRL (GAUZE/BANDAGES/DRESSINGS) ×1 IMPLANT
GLOVE BIO SURGEON STRL SZ8 (GLOVE) ×2 IMPLANT
GLOVE INDICATOR 8.0 STRL GRN (GLOVE) ×2 IMPLANT
GOWN STRL REUS W/ TWL LRG LVL3 (GOWN DISPOSABLE) ×1 IMPLANT
GOWN STRL REUS W/ TWL XL LVL3 (GOWN DISPOSABLE) ×1 IMPLANT
GOWN STRL REUS W/TWL LRG LVL3 (GOWN DISPOSABLE) ×2
GOWN STRL REUS W/TWL XL LVL3 (GOWN DISPOSABLE) ×2
KIT RM TURNOVER STRD PROC AR (KITS) ×2 IMPLANT
NDL FILTER BLUNT 18X1 1/2 (NEEDLE) ×1 IMPLANT
NEEDLE FILTER BLUNT 18X 1/2SAF (NEEDLE) ×1
NEEDLE FILTER BLUNT 18X1 1/2 (NEEDLE) ×1 IMPLANT
NS IRRIG 500ML POUR BTL (IV SOLUTION) ×2 IMPLANT
PACK EXTREMITY ARMC (MISCELLANEOUS) ×2 IMPLANT
PAD CAST CTTN 4X4 STRL (SOFTGOODS) ×2 IMPLANT
PADDING CAST COTTON 4X4 STRL (SOFTGOODS) ×4
PLATE LOCK COMP 7H FOOT (Plate) ×2 IMPLANT
PUTTY DBX 1CC (Putty) ×2 IMPLANT
PUTTY DBX 1CC DEPUY (Putty) IMPLANT
SCREW CORTICAL 3.5MM  16MM (Screw) ×7 IMPLANT
SCREW CORTICAL 3.5MM 14MM (Screw) ×5 IMPLANT
SCREW CORTICAL 3.5MM 16MM (Screw) IMPLANT
SCREW CORTICAL 3.5MM 18MM (Screw) ×1 IMPLANT
SPLINT CAST 1 STEP 3X12 (MISCELLANEOUS) ×2 IMPLANT
STAPLER SKIN PROX 35W (STAPLE) ×2 IMPLANT
STOCKINETTE IMPERVIOUS 9X36 MD (GAUZE/BANDAGES/DRESSINGS) ×2 IMPLANT
STRIP CLOSURE SKIN 1/2X4 (GAUZE/BANDAGES/DRESSINGS) ×2 IMPLANT
SUT PROLENE 4 0 PS 2 18 (SUTURE) ×2 IMPLANT
SUT VIC AB 2-0 SH 27 (SUTURE) ×4
SUT VIC AB 2-0 SH 27XBRD (SUTURE) ×1 IMPLANT
SUT VIC AB 3-0 SH 27 (SUTURE) ×4
SUT VIC AB 3-0 SH 27X BRD (SUTURE) ×1 IMPLANT
SWABSTK COMLB BENZOIN TINCTURE (MISCELLANEOUS) ×1 IMPLANT
SYRINGE 10CC LL (SYRINGE) ×2 IMPLANT
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
## 2017-01-30 NOTE — Anesthesia Post-op Follow-up Note (Signed)
Anesthesia QCDR form completed.        

## 2017-01-30 NOTE — Transfer of Care (Signed)
Immediate Anesthesia Transfer of Care Note  Patient: Katherine [name]  Procedure(s) Performed: OPEN REDUCTION INTERNAL FIXATION (ORIF) RADIAL FRACTURE (Left Arm Lower)  Patient Location: PACU  Anesthesia Type:General  Level of Consciousness: awake and alert   Airway & Oxygen Therapy: Patient Spontanous Breathing and Patient connected to face mask oxygen  Post-op Assessment: Report given to RN and Post -op Vital signs reviewed and stable  Post vital signs: Reviewed and stable  Last Vitals:  Vitals:   01/30/17 0028 01/30/17 0832  BP: (!) 131/59 128/72  Pulse: 93 80  Resp: 18   Temp: 37.4 C 36.8 C  SpO2: 99% 97%    Last Pain:  Vitals:   01/30/17 1151  TempSrc:   PainSc: 3       Patients Stated Pain Goal: 3 (12/24/28 0762)  Complications: No apparent anesthesia complications

## 2017-01-30 NOTE — Anesthesia Preprocedure Evaluation (Signed)
Anesthesia Evaluation  Patient identified by MRN, date of birth, ID band Patient awake    Reviewed: Allergy & Precautions, NPO status , Patient's Chart, lab work & pertinent test results  History of Anesthesia Complications Negative for: history of anesthetic complications  Airway Mallampati: I  TM Distance: >3 FB Neck ROM: Full    Dental no notable dental hx.    Pulmonary neg pulmonary ROS, neg sleep apnea, neg COPD,    breath sounds clear to auscultation- rhonchi (-) wheezing      Cardiovascular Exercise Tolerance: Good (-) hypertension(-) CAD, (-) Past MI and (-) Cardiac Stents  Rhythm:Regular Rate:Normal - Systolic murmurs and - Diastolic murmurs    Neuro/Psych PSYCHIATRIC DISORDERS Anxiety Depression negative neurological ROS     GI/Hepatic negative GI ROS, Neg liver ROS,   Endo/Other  negative endocrine ROSneg diabetes  Renal/GU negative Renal ROS     Musculoskeletal  (+) Arthritis ,   Abdominal (+) - obese,   Peds  Hematology negative hematology ROS (+)   Anesthesia Other Findings Past Medical History: No date: Alcoholism ([place]) No date: Anxiety No date: Arthritis     Comment:  Rheumatoid  No date: Chronic back pain     Comment:  followed by pain clinic No date: Common bile duct dilatation No date: Depression No date: Retention of urine, unspecified No date: Scoliosis (and kyphoscoliosis), idiopathic   Reproductive/Obstetrics                             Anesthesia Physical Anesthesia Plan  ASA: II  Anesthesia Plan: General   Post-op Pain Management:    Induction: Intravenous  PONV Risk Score and Plan: 2 and Ondansetron and Dexamethasone  Airway Management Planned: LMA  Additional Equipment:   Intra-op Plan:   Post-operative Plan:   Informed Consent: I have reviewed the patients History and Physical, chart, labs and discussed the procedure including the risks,  benefits and alternatives for the proposed anesthesia with the patient or authorized representative who has indicated his/her understanding and acceptance.   Dental advisory given  Plan Discussed with: CRNA and Anesthesiologist  Anesthesia Plan Comments:         Anesthesia Quick Evaluation

## 2017-01-30 NOTE — Anesthesia Procedure Notes (Signed)
Procedure Name: LMA Insertion Date/Time: 01/30/2017 5:14 PM Performed by: Aline Brochure Pre-anesthesia Checklist: Patient identified, Emergency Drugs available, Suction available and Patient being monitored Patient Re-evaluated:Patient Re-evaluated prior to induction Oxygen Delivery Method: Circle system utilized Preoxygenation: Pre-oxygenation with 100% oxygen Induction Type: IV induction Ventilation: Mask ventilation without difficulty LMA: LMA inserted LMA Size: 3.5 Number of attempts: 1 Placement Confirmation: positive ETCO2 and breath sounds checked- equal and bilateral Tube secured with: Tape Dental Injury: Teeth and Oropharynx as per pre-operative assessment

## 2017-01-31 ENCOUNTER — Encounter: Payer: Self-pay | Admitting: Surgery

## 2017-01-31 MED ORDER — OXYCODONE HCL 5 MG PO TABS: 5.0000 mg | ORAL_TABLET | ORAL | 0 refills | Status: DC | PRN

## 2017-01-31 NOTE — Progress Notes (Signed)
Clinical Education officer, museum (CSW) received SNF consult. PT is recommending "Follow-up with surgeon for future OP PT needs." RN case manager aware of above. Please reconsult if future social work needs arise. CSW signing off.   [employer], LCSW [phone]

## 2017-01-31 NOTE — Care Management CC44 (Signed)
Condition Code 44 Documentation Completed  Patient Details  Name: Katherine [name] MRN: [id number] Date of Birth: [birth year]   Condition Code 44 given:  Yes Patient signature on Condition Code 44 notice:  Yes Documentation of 2 MD's agreement:  Yes Code 44 added to claim:  Yes    Jolly Mango, RN 01/31/2017, 11:26 AM

## 2017-01-31 NOTE — Progress Notes (Signed)
Per OT, patient verbalizes numbness on her Left pinky finger.Assessment findings: normal color (pink), warm temperature and capillary refill = less than 3 seconds on all left hand fingers. Blisters noted on upper arm Paged MD Poggi and made aware.

## 2017-01-31 NOTE — Final Progress Note (Signed)
  Subjective: 1 Day Post-Op Procedure(s) (LRB): OPEN REDUCTION INTERNAL FIXATION (ORIF) RADIAL FRACTURE (Left) Patient reports pain as mild.   Patient is well, and has had no acute complaints or problems Plan is to go Home after hospital stay. Negative for chest pain and shortness of breath Fever: no Gastrointestinal:Negative for nausea and vomiting  Objective: Vital signs in last 24 hours: Temp:  [97.7 F (36.5 C)-98.4 F (36.9 C)] 97.8 F (36.6 C) (10/31 0733) Pulse Rate:  [74-114] 74 (10/31 0733) Resp:  [10-24] 18 (10/31 0447) BP: (115-161)/(58-91) 131/60 (10/31 0733) SpO2:  [92 %-100 %] 96 % (10/31 0733)  Intake/Output from previous day:  Intake/Output Summary (Last 24 hours) at 01/31/17 0802 Last data filed at 01/31/17 0646  Gross per 24 hour  Intake          2078.75 ml  Output             2725 ml  Net          -646.25 ml    Intake/Output this shift: No intake/output data recorded.  Labs:  Recent Labs  01/29/17 2009  HGB 13.5    Recent Labs  01/29/17 2009  WBC 5.1  RBC 4.45  HCT 40.7  PLT 234    Recent Labs  01/29/17 2009  NA 138  K 4.7  CL 103  CO2 23  BUN 10  CREATININE 0.72  GLUCOSE 102*  CALCIUM 9.0    Recent Labs  01/29/17 2009  INR 0.96     EXAM General - Patient is Alert, Appropriate and Oriented Extremity - ABD soft  Splint applied to the left arm, sensation intact to the fingers on dorsal and volar aspect.  Cap refill intact to each digit.  Able to flex and extend digits on command. Dressing/Incision - clean, dry, no drainage Motor Function - intact, moving foot and toes well on exam.  Past Medical History:  Diagnosis Date  . Alcoholism ([place])   . Anxiety   . Arthritis    Rheumatoid   . Chronic back pain    followed by pain clinic  . Common bile duct dilatation   . Depression   . Retention of urine, unspecified   . Scoliosis (and kyphoscoliosis), idiopathic    Assessment/Plan: 1 Day Post-Op Procedure(s)  (LRB): OPEN REDUCTION INTERNAL FIXATION (ORIF) RADIAL FRACTURE (Left) Active Problems:   Forearm fracture  Estimated body mass index is 22.78 kg/m as calculated from the following:   Height as of 09/01/14: 5\' 5"  (1.651 m).   Weight as of this encounter: 62.1 kg (136 lb 14.4 oz). Advance diet Up with therapy D/C IV fluids when tolerating po intake.  Labs reviewed this AM. Splint intact. Plan will be for discharge home this afternoon.  DVT Prophylaxis - Lovenox Non-weightbearing to the left upper extremity.  Raquel James, PA-C [facility] Orthopa[facility] Surgery 01/31/2017, 8:02 AM
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
## 2017-01-31 NOTE — Care Management (Signed)
RNCM consult for discharge planning. Chart reviewed on day of discharge. No anticoagulant. No home health ordered. Not seen by therapy prior to discharge. No needs identified. Case closed

## 2017-01-31 NOTE — Care Management Obs Status (Signed)
[place] NOTIFICATION   Patient Details  Name: Katherine [name] MRN: [id number] Date of Birth: [birth year]   Medicare Observation Status Notification Given:  Yes    Jolly Mango, RN 01/31/2017, 11:26 AM
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
## 2017-01-31 NOTE — Discharge Instructions (Signed)
Diet: As you were doing prior to hospitalization   Shower:  May shower but keep the wounds dry, use an occlusive plastic wrap, NO SOAKING IN TUB.  If the bandage gets wet, change with a clean dry gauze.  Dressing:  KEEP DRESSING ON LEFT ARM UNTIL FOLLOW-UP APPOINTMENT.  Activity:  Increase activity slowly as tolerated, but follow the weight bearing instructions below.  No lifting or driving for 6 weeks.  Weight Bearing:   Minimal weightbearing to the left arm.  To prevent constipation: you may use a stool softener such as -  Colace (over the counter) 100 mg by mouth twice a day  Drink plenty of fluids (prune juice may be helpful) and high fiber foods Miralax (over the counter) for constipation as needed.    Itching:  If you experience itching with your medications, try taking only a single pain pill, or even half a pain pill at a time.  You may take up to 10 pain pills per day, and you can also use benadryl over the counter for itching or also to help with sleep.   Precautions:  If you experience chest pain or shortness of breath - call 911 immediately for transfer to the hospital emergency department!!  If you develop a fever greater that 101 F, purulent drainage from wound, increased redness or drainage from wound, or calf pain-Call [facility]                                               Follow- Up Appointment:  Please call for an appointment to be seen in 2 weeks at [facility]

## 2017-01-31 NOTE — Progress Notes (Signed)
Discharge instructions and med details reviewed with patient. Printed prescription for oxycodone and AVS given to patient. All questions answered. Patient was escorted out via wheelchair.

## 2017-01-31 NOTE — Anesthesia Postprocedure Evaluation (Signed)
Anesthesia Post Note  Patient: Katherine [name]  Procedure(s) Performed: OPEN REDUCTION INTERNAL FIXATION (ORIF) RADIAL FRACTURE (Left Arm Lower)  Patient location during evaluation: PACU Anesthesia Type: General Level of consciousness: awake and alert and oriented Pain management: pain level controlled Vital Signs Assessment: post-procedure vital signs reviewed and stable Respiratory status: spontaneous breathing, nonlabored ventilation and respiratory function stable Cardiovascular status: blood pressure returned to baseline and stable Postop Assessment: no signs of nausea or vomiting Anesthetic complications: no     Last Vitals:  Vitals:   01/30/17 2201 01/30/17 2301  BP: (!) 144/65 (!) 138/58  Pulse: 93 92  Resp:    Temp:    SpO2: 92% 93%    Last Pain:  Vitals:   01/30/17 2317  TempSrc:   PainSc: 10-Worst pain ever                 Dionisio Aragones

## 2017-04-18 ENCOUNTER — Other Ambulatory Visit: Payer: Self-pay

## 2017-04-18 ENCOUNTER — Encounter: Payer: Self-pay | Admitting: Occupational Therapy

## 2017-04-18 ENCOUNTER — Ambulatory Visit: Payer: Medicare HMO | Attending: Student | Admitting: Occupational Therapy

## 2017-04-18 DIAGNOSIS — M25532 Pain in left wrist: Secondary | ICD-10-CM | POA: Diagnosis present

## 2017-04-18 DIAGNOSIS — M25632 Stiffness of left wrist, not elsewhere classified: Secondary | ICD-10-CM | POA: Insufficient documentation

## 2017-04-18 DIAGNOSIS — M6281 Muscle weakness (generalized): Secondary | ICD-10-CM

## 2017-04-18 DIAGNOSIS — L905 Scar conditions and fibrosis of skin: Secondary | ICD-10-CM

## 2017-04-18 NOTE — Patient Instructions (Signed)
Heat 2 or 3 x day  PROM for wrist ext , flexion , Sup and pronation  10 reps  1 lbs weight for pron, sup, RD,UD, ext ,and flexion of wrist  10 reps  2 sets day  If pain free in 3-4 days can do 3 sets
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
## 2017-04-24 ENCOUNTER — Ambulatory Visit: Payer: Medicare HMO | Admitting: Occupational Therapy

## 2017-04-24 DIAGNOSIS — L905 Scar conditions and fibrosis of skin: Secondary | ICD-10-CM

## 2017-04-24 DIAGNOSIS — M25632 Stiffness of left wrist, not elsewhere classified: Secondary | ICD-10-CM | POA: Diagnosis not present

## 2017-04-24 DIAGNOSIS — M25532 Pain in left wrist: Secondary | ICD-10-CM

## 2017-04-24 DIAGNOSIS — M6281 Muscle weakness (generalized): Secondary | ICD-10-CM

## 2017-04-24 NOTE — Therapy (Signed)
[place] [facility] [address] [place], [address] Phone: [phone]   Fax:  [phone]  Occupational Therapy Treatment  Patient Details  Name: Katherine [name] MRN: [id number] Date of Birth: 05-02-1950 Referring Provider: Roland Rack   Encounter Date: 04/24/2017  OT End of Session - 04/24/17 1511    Visit Number  2    Number of Visits  12    Date for OT Re-Evaluation  05/23/17    OT Start Time  1432    OT Stop Time  1511    OT Time Calculation (min)  39 min    Activity Tolerance  Patient tolerated treatment well    Behavior During Therapy  [facility] for tasks assessed/performed       Past Medical History:  Diagnosis Date  . Alcoholism ([place])   . Anxiety   . Arthritis    Rheumatoid   . Chronic back pain    followed by pain clinic  . Common bile duct dilatation   . Depression   . Retention of urine, unspecified   . Scoliosis (and kyphoscoliosis), idiopathic     Past Surgical History:  Procedure Laterality Date  . ABDOMINAL HYSTERECTOMY    . APPENDECTOMY    . BACK SURGERY  10/04   Back surgery- lumbar decompression (01/2003)  . BREAST ENHANCEMENT SURGERY  1998  . 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 ORIF RADIAL FRACTURE Left 01/30/2017   Procedure: OPEN REDUCTION INTERNAL FIXATION (ORIF) RADIAL FRACTURE;  Surgeon: Corky Mull, MD;  Location: [facility] ORS;  Service: Orthopedics;  Laterality: Left;  . SHOULDER OPEN ROTATOR CUFF REPAIR  1994   left shoulder repair    There were no vitals filed for this visit.  Subjective Assessment - 04/24/17 1448    Subjective   Pain only when doing something I should not - like pick up something heavy - typing better - now my rotation better - making bed still hard , and I am dropping objects     Patient Stated Goals  I want to  be able to pick up things, turn my hand , driving, cut food , make jewelry ,  work in yard and  ride my bike - get the thumb and fingers also more flexible    Currently in Pain?  No/denies         [facility] OT Assessment - 04/24/17 0001      AROM   Left Forearm Pronation  60 Degrees end of session 75    Left Forearm Supination  90 Degrees    Left Wrist Extension  65 Degrees    Left Wrist Flexion  85 Degrees      assess progress in ROM at wrist in all planes  Review PROM with pt for wrist pronation , sup , flexion and extention  Needed mod A           OT Treatments/Exercises (OP) - 04/24/17 0001      Moist Heat Therapy   Number Minutes Moist Heat  10 Minutes    Moist Heat Location  Wrist     with pronation stretch during heatingpad  Pronation stretch by OT 10 reps  Upgrade HEP to 16 oz  Hammer for sup/pro, RD , UD  And wrist ext/flexion  10 reps  Add to HEP   she can do it 2 x day  If no increase pain in  3 days  Can do 2 sets    Pain to stay under 2/10 with stretch         OT Education - 04/24/17 1511    Education provided  Yes    Education Details  upgrade HEP - and progress discuss    Person(s) Educated  Patient    Methods  Explanation;Demonstration;Tactile cues;Verbal cues;Handout    Comprehension  Returned demonstration;Verbalized understanding       OT Short Term Goals - 04/18/17 1749      OT SHORT TERM GOAL #1   Title  Pt to be independent in HEP to increase L wrist in all planes to type on computer , use in bathing and dressing with no issues     Baseline  see flowsheet for ROM measurements     Time  2    Period  Weeks    Status  New    Target Date  05/02/17      OT SHORT TERM GOAL #2   Title  Pain on PRWHE improve with more than 8 points     Baseline  at eval pain on PRWHE was 13/50     Time  3    Period  Weeks    Status  New    Target Date  05/09/17        OT Long Term Goals - 04/18/17 1751      OT LONG TERM GOAL #1   Title  Pt L wrist strength increase to 4+/5 to use hand in using knife , push up out of bed , doing  vacuuming without symptoms     Baseline  ROM decrease , strength 3/5    Time  6    Period  Weeks    Status  New    Target Date  05/30/17      OT LONG TERM GOAL #2   Title  L grip strength increase with 5 lbs to be able to carry more than 8 lbs without symptoms     Baseline  Grip on L 35, R 55 lbs     Time  6    Period  Weeks    Status  New    Target Date  05/30/17      OT LONG TERM GOAL #3   Title  Function score on PRWHE improve more than 10 points     Baseline  Function score at eval 24.5/50    Time  6    Period  Weeks    Status  New    Target Date  05/30/17            Plan - 04/24/17 1512    Clinical Impression Statement  Pt showed great progress since last time - pronation the lease but during session increase 15 degrees without pain increase - upgrade strength - and pt to contact MD about bone stimulater- did not hear anything yet - and it is now 2 wks since seeing surgeon     Occupational performance deficits (Please refer to evaluation for details):  ADL's;IADL's;Play;Leisure    Rehab Potential  Good    OT Frequency  2x / week    OT Duration  6 weeks    OT Treatment/Interventions  Fluidtherapy;Self-care/ADL training;Splinting;Therapeutic exercise;Scar mobilization;Passive range of motion;Manual Therapy;Paraffin    Plan  assess progress and upgrade as needed     Clinical Decision Making  Several treatment options, min-mod task modification necessary    OT Home Exercise Plan  see  pt instruction    Consulted and Agree with Plan of Care  Patient       Patient will benefit from skilled therapeutic intervention in order to improve the following deficits and impairments:  Pain, Impaired flexibility, Decreased scar mobility, Decreased strength, Decreased range of motion, Impaired UE functional use  Visit Diagnosis: Stiffness of left wrist, not elsewhere classified  Muscle weakness (generalized)  Pain in left wrist  Scar condition and fibrosis of skin    Problem  List Patient Active Problem List   Diagnosis Date Noted  . Forearm fracture 01/29/2017  . Fall with injury 11/30/2015  . Elevated blood pressure (not hypertension) 11/30/2015  . Chronic pain syndrome 09/01/2014  . Heart palpitations 10/30/2013  . Routine general medical examination at a health care facility 06/28/2012  . Arthritis of hand 02/11/2010  . CONSTIPATION 04/05/2009  . Episodic mood disorder ([place]) 07/21/2008  . SCOLIOSIS 07/21/2008    Rosalyn Gess OTR/L,CLT  04/24/2017, 3:14 PM  [place] [facility] [address], [place], [address] Phone: [phone]   Fax:  [phone]  Name: Katherine [name] MRN: [id number] Date of Birth: [birth year]

## 2017-04-24 NOTE — Patient Instructions (Signed)
Use heating pad for pronation stretch  16 oz  Hammer for sup/rp, RD , UD  And wrist ext/flexion  10 reps   2 x day  If no increase pain in 3 days  Can do 2 sets

## 2017-04-30 ENCOUNTER — Ambulatory Visit: Payer: Medicare HMO | Admitting: Occupational Therapy

## 2017-04-30 DIAGNOSIS — M25632 Stiffness of left wrist, not elsewhere classified: Secondary | ICD-10-CM | POA: Diagnosis not present

## 2017-04-30 DIAGNOSIS — M6281 Muscle weakness (generalized): Secondary | ICD-10-CM

## 2017-04-30 DIAGNOSIS — M25532 Pain in left wrist: Secondary | ICD-10-CM

## 2017-04-30 DIAGNOSIS — L905 Scar conditions and fibrosis of skin: Secondary | ICD-10-CM

## 2017-04-30 NOTE — Therapy (Signed)
[place] [facility] [address] [place], [address] Phone: [phone]   Fax:  [phone]  Occupational Therapy Treatment  Patient Details  Name: Katherine [name] MRN: [id number] Date of Birth: Jun 14, 1950 Referring Provider: Roland Rack   Encounter Date: 04/30/2017  OT End of Session - 04/30/17 1641    Visit Number  3    Number of Visits  12    Date for OT Re-Evaluation  05/23/17    OT Start Time  1302    OT Stop Time  1344    OT Time Calculation (min)  42 min    Activity Tolerance  Patient tolerated treatment well    Behavior During Therapy  [facility] for tasks assessed/performed       Past Medical History:  Diagnosis Date  . Alcoholism ([place])   . Anxiety   . Arthritis    Rheumatoid   . Chronic back pain    followed by pain clinic  . Common bile duct dilatation   . Depression   . Retention of urine, unspecified   . Scoliosis (and kyphoscoliosis), idiopathic     Past Surgical History:  Procedure Laterality Date  . ABDOMINAL HYSTERECTOMY    . APPENDECTOMY    . BACK SURGERY  10/04   Back surgery- lumbar decompression (01/2003)  . BREAST ENHANCEMENT SURGERY  1998  . 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 ORIF RADIAL FRACTURE Left 01/30/2017   Procedure: OPEN REDUCTION INTERNAL FIXATION (ORIF) RADIAL FRACTURE;  Surgeon: Corky Mull, MD;  Location: [facility] ORS;  Service: Orthopedics;  Laterality: Left;  . SHOULDER OPEN ROTATOR CUFF REPAIR  1994   left shoulder repair    There were no vitals filed for this visit.  Subjective Assessment - 04/30/17 1320    Subjective   I worked my forearm and was somewhat sore yesterday - but I maybe did overdo it - they said bonestimulator the insurance says need to wait 90 days  - I can type better and using it more     Patient Stated Goals  I want to  be able to pick up things, turn my hand , driving, cut food , make jewelry ,   work in yard and ride my bike - get the thumb and fingers also more flexible    Currently in Pain?  Yes         [facility] OT Assessment - 04/30/17 0001      AROM   Left Forearm Pronation  70 Degrees    Left Forearm Supination  90 Degrees    Left Wrist Extension  67 Degrees    Left Wrist Flexion  90 Degrees       Measurements taken - progress still in all planes that was impaired          OT Treatments/Exercises (OP) - 04/30/17 0001      Moist Heat Therapy   Number Minutes Moist Heat  8 Minutes    Moist Heat Location  Wrist forearm in pronation stretch        with pronation stretch during heatingpad  Done Graston tool nr 2 on volar forearm - sweeping prior to PROM and stretch  Pronation stretch by OT 10 reps Review  16 oz  Hammer for sup/pro, RD , UD  And wrist ext/flexion  10 reps  Pt needed min A - did not do wrist extention  She can do 2 sets  1 day if no increase pain  And later this week if no pain try 3 sets 1 x day    Pain to stay under 2/10 with stretch        OT Education - 04/30/17 1641    Education Details  review HEP again for stretch and weight     Person(s) Educated  Patient    Methods  Explanation;Demonstration;Tactile cues;Verbal cues;Handout    Comprehension  Returned demonstration;Verbalized understanding       OT Short Term Goals - 04/18/17 1749      OT SHORT TERM GOAL #1   Title  Pt to be independent in HEP to increase L wrist in all planes to type on computer , use in bathing and dressing with no issues     Baseline  see flowsheet for ROM measurements     Time  2    Period  Weeks    Status  New    Target Date  05/02/17      OT SHORT TERM GOAL #2   Title  Pain on PRWHE improve with more than 8 points     Baseline  at eval pain on PRWHE was 13/50     Time  3    Period  Weeks    Status  New    Target Date  05/09/17        OT Long Term Goals - 04/18/17 1751      OT LONG TERM GOAL #1   Title  Pt L wrist strength  increase to 4+/5 to use hand in using knife , push up out of bed , doing vacuuming without symptoms     Baseline  ROM decrease , strength 3/5    Time  6    Period  Weeks    Status  New    Target Date  05/30/17      OT LONG TERM GOAL #2   Title  L grip strength increase with 5 lbs to be able to carry more than 8 lbs without symptoms     Baseline  Grip on L 35, R 55 lbs     Time  6    Period  Weeks    Status  New    Target Date  05/30/17      OT LONG TERM GOAL #3   Title  Function score on PRWHE improve more than 10 points     Baseline  Function score at eval 24.5/50    Time  6    Period  Weeks    Status  New    Target Date  05/30/17            Plan - 04/30/17 1642    Clinical Impression Statement  Pt cont to make progress in L wrist and forearm AROM each session - need review again on PROM for pronation of forearm and doing weight for wrist in all planes - pt did phone about bone stimulator - per insurance has to await about  3 months     Occupational performance deficits (Please refer to evaluation for details):  ADL's;IADL's;Play;Leisure    OT Frequency  2x / week    OT Duration  4 weeks    OT Treatment/Interventions  Fluidtherapy;Self-care/ADL training;Splinting;Therapeutic exercise;Scar mobilization;Passive range of motion;Manual Therapy;Paraffin    Plan  assess progress and upgrade weight if possible     Clinical Decision Making  Several treatment options, min-mod task modification necessary  OT Home Exercise Plan  see pt instruction    Consulted and Agree with Plan of Care  Patient       Patient will benefit from skilled therapeutic intervention in order to improve the following deficits and impairments:  Pain, Impaired flexibility, Decreased scar mobility, Decreased strength, Decreased range of motion, Impaired UE functional use  Visit Diagnosis: Stiffness of left wrist, not elsewhere classified  Muscle weakness (generalized)  Pain in left wrist  Scar  condition and fibrosis of skin    Problem List Patient Active Problem List   Diagnosis Date Noted  . Forearm fracture 01/29/2017  . Fall with injury 11/30/2015  . Elevated blood pressure (not hypertension) 11/30/2015  . Chronic pain syndrome 09/01/2014  . Heart palpitations 10/30/2013  . Routine general medical examination at a health care facility 06/28/2012  . Arthritis of hand 02/11/2010  . CONSTIPATION 04/05/2009  . Episodic mood disorder ([place]) 07/21/2008  . SCOLIOSIS 07/21/2008    Rosalyn Gess OTR/L,CLT 04/30/2017, 4:45 PM  [place] [facility] [address], [place], [address] Phone: [phone]   Fax:  [phone]  Name: Katherine [name] MRN: [id number] Date of Birth: 10-21-1950

## 2017-04-30 NOTE — Patient Instructions (Signed)
Cont with same HEP - but review again correct [place] of doing 16 oz hammer HEP  Can increase gradually sets to 3 this week - but pain should stay below 3/10

## 2017-05-07 ENCOUNTER — Ambulatory Visit: Payer: Medicare HMO | Attending: Family Medicine | Admitting: Occupational Therapy

## 2017-05-07 DIAGNOSIS — M25532 Pain in left wrist: Secondary | ICD-10-CM | POA: Diagnosis present

## 2017-05-07 DIAGNOSIS — M6281 Muscle weakness (generalized): Secondary | ICD-10-CM

## 2017-05-07 DIAGNOSIS — M25632 Stiffness of left wrist, not elsewhere classified: Secondary | ICD-10-CM | POA: Diagnosis not present

## 2017-05-07 DIAGNOSIS — L905 Scar conditions and fibrosis of skin: Secondary | ICD-10-CM | POA: Diagnosis present

## 2017-05-07 NOTE — Therapy (Signed)
[place] [facility] [address] [place], [address] Phone: [phone]   Fax:  [phone]  Occupational Therapy Treatment  Patient Details  Name: Katherine [name] MRN: [id number] Date of Birth: Mar 03, 1951 Referring Provider: Roland Rack   Encounter Date: 05/07/2017  OT End of Session - 05/07/17 1521    Visit Number  4    Number of Visits  12    Date for OT Re-Evaluation  05/23/17    OT Start Time  1420    OT Stop Time  1456    OT Time Calculation (min)  36 min    Activity Tolerance  Patient tolerated treatment well    Behavior During Therapy  [facility] for tasks assessed/performed       Past Medical History:  Diagnosis Date  . Alcoholism ([place])   . Anxiety   . Arthritis    Rheumatoid   . Chronic back pain    followed by pain clinic  . Common bile duct dilatation   . Depression   . Retention of urine, unspecified   . Scoliosis (and kyphoscoliosis), idiopathic     Past Surgical History:  Procedure Laterality Date  . ABDOMINAL HYSTERECTOMY    . APPENDECTOMY    . BACK SURGERY  10/04   Back surgery- lumbar decompression (01/2003)  . BREAST ENHANCEMENT SURGERY  1998  . 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 ORIF RADIAL FRACTURE Left 01/30/2017   Procedure: OPEN REDUCTION INTERNAL FIXATION (ORIF) RADIAL FRACTURE;  Surgeon: Corky Mull, MD;  Location: [facility] ORS;  Service: Orthopedics;  Laterality: Left;  . SHOULDER OPEN ROTATOR CUFF REPAIR  1994   left shoulder repair    There were no vitals filed for this visit.  Subjective Assessment - 05/07/17 1519    Subjective   Doing okay - I did the stretches and strengthening with the hammer - I was little sore yesterday - I have appt Wed for xray and then they fitting the bone stimulator on me later that day     Patient Stated Goals  I want to  be able to pick up things, turn my hand , driving, cut food , make jewelry ,   work in yard and ride my bike - get the thumb and fingers also more flexible    Currently in Pain?  No/denies         [facility] OT Assessment - 05/07/17 0001      AROM   Left Forearm Pronation  80 Degrees    Left Forearm Supination  90 Degrees      Strength   Right Hand Grip (lbs)  50    Right Hand Lateral Pinch  14 lbs    Right Hand 3 Point Pinch  13 lbs    Left Hand Grip (lbs)  41    Left Hand Lateral Pinch  14 lbs    Left Hand 3 Point Pinch  14 lbs         Measurements taken for ROM and grip /prehension strength - see flowsheet       OT Treatments/Exercises (OP) - 05/07/17 0001      Moist Heat Therapy   Number Minutes Moist Heat  8 Minutes    Moist Heat Location  -- Forearm pronation stretch         with pronation stretch during heatingpad  Done Graston tool nr 2 on volar forearm -  sweeping prior to PROM and stretch  Pronation stretch by OT 10 reps Review  16 oz Hammer for pronation   Pt needed min v/c to keep elbow to side    make sure stretch is out of forearm and not wrist  Pain to stay under 2/10 with stretch       OT Education - 05/07/17 1520    Education provided  Yes    Education Details  Focus on pronation stretch and strengthening     Person(s) Educated  Patient    Methods  Explanation;Demonstration;Tactile cues    Comprehension  Verbalized understanding;Returned demonstration       OT Short Term Goals - 04/18/17 1749      OT SHORT TERM GOAL #1   Title  Pt to be independent in HEP to increase L wrist in all planes to type on computer , use in bathing and dressing with no issues     Baseline  see flowsheet for ROM measurements     Time  2    Period  Weeks    Status  New    Target Date  05/02/17      OT SHORT TERM GOAL #2   Title  Pain on PRWHE improve with more than 8 points     Baseline  at eval pain on PRWHE was 13/50     Time  3    Period  Weeks    Status  New    Target Date  05/09/17        OT Long Term Goals -  04/18/17 1751      OT LONG TERM GOAL #1   Title  Pt L wrist strength increase to 4+/5 to use hand in using knife , push up out of bed , doing vacuuming without symptoms     Baseline  ROM decrease , strength 3/5    Time  6    Period  Weeks    Status  New    Target Date  05/30/17      OT LONG TERM GOAL #2   Title  L grip strength increase with 5 lbs to be able to carry more than 8 lbs without symptoms     Baseline  Grip on L 35, R 55 lbs     Time  6    Period  Weeks    Status  New    Target Date  05/30/17      OT LONG TERM GOAL #3   Title  Function score on PRWHE improve more than 10 points     Baseline  Function score at eval 24.5/50    Time  6    Period  Weeks    Status  New    Target Date  05/30/17            Plan - 05/07/17 1522    Clinical Impression Statement  Pt cont to make progress in pronation - and then grip and prehension strength were increase compare to eval - pt report increase use of L hand- pt to cont to focus on pronation and report she is being fit with bone stimulator Wed - pt to cont with HEP for 2 wks this time     Occupational performance deficits (Please refer to evaluation for details):  ADL's;IADL's;Play;Leisure    Rehab Potential  Good    OT Frequency  Biweekly    OT Duration  4 weeks    OT Treatment/Interventions  Fluidtherapy;Self-care/ADL training;Splinting;Therapeutic exercise;Scar mobilization;Passive  range of motion;Manual Therapy;Paraffin    Plan  assess progress last 2 wk at home     Clinical Decision Making  Several treatment options, min-mod task modification necessary    OT Home Exercise Plan  see pt instruction    Consulted and Agree with Plan of Care  Patient       Patient will benefit from skilled therapeutic intervention in order to improve the following deficits and impairments:  Pain, Impaired flexibility, Decreased scar mobility, Decreased strength, Decreased range of motion, Impaired UE functional use  Visit  Diagnosis: Stiffness of left wrist, not elsewhere classified  Muscle weakness (generalized)  Pain in left wrist  Scar condition and fibrosis of skin    Problem List Patient Active Problem List   Diagnosis Date Noted  . Forearm fracture 01/29/2017  . Fall with injury 11/30/2015  . Elevated blood pressure (not hypertension) 11/30/2015  . Chronic pain syndrome 09/01/2014  . Heart palpitations 10/30/2013  . Routine general medical examination at a health care facility 06/28/2012  . Arthritis of hand 02/11/2010  . CONSTIPATION 04/05/2009  . Episodic mood disorder ([place]) 07/21/2008  . SCOLIOSIS 07/21/2008    Rosalyn Gess OTR/L,CLT 05/07/2017, 3:24 PM  [place] [facility] [address] [place], [address] Phone: [phone]   Fax:  [phone]  Name: Katherine [name] MRN: [id number] Date of Birth: Mar 31, 1951

## 2017-05-21 ENCOUNTER — Encounter: Payer: Medicare HMO | Admitting: Occupational Therapy

## 2017-05-23 ENCOUNTER — Ambulatory Visit: Payer: Medicare HMO | Admitting: Occupational Therapy

## 2017-05-23 DIAGNOSIS — M25632 Stiffness of left wrist, not elsewhere classified: Secondary | ICD-10-CM

## 2017-05-23 DIAGNOSIS — M6281 Muscle weakness (generalized): Secondary | ICD-10-CM

## 2017-05-23 DIAGNOSIS — M25532 Pain in left wrist: Secondary | ICD-10-CM

## 2017-05-23 DIAGNOSIS — L905 Scar conditions and fibrosis of skin: Secondary | ICD-10-CM

## 2017-05-23 NOTE — Therapy (Signed)
[place] [facility] [address] [place], [address] Phone: [phone]   Fax:  [phone]  Occupational Therapy Treatment  Patient Details  Name: Katherine [name] MRN: [id number] Date of Birth: November 17, 1950 Referring Provider: Roland Rack   Encounter Date: 05/23/2017  OT End of Session - 05/23/17 1526    Visit Number  5    Number of Visits  12    Date for OT Re-Evaluation  05/23/17    OT Start Time  1210    OT Stop Time  1239    OT Time Calculation (min)  29 min    Activity Tolerance  Patient tolerated treatment well    Behavior During Therapy  [facility] for tasks assessed/performed       Past Medical History:  Diagnosis Date  . Alcoholism ([place])   . Anxiety   . Arthritis    Rheumatoid   . Chronic back pain    followed by pain clinic  . Common bile duct dilatation   . Depression   . Retention of urine, unspecified   . Scoliosis (and kyphoscoliosis), idiopathic     Past Surgical History:  Procedure Laterality Date  . ABDOMINAL HYSTERECTOMY    . APPENDECTOMY    . BACK SURGERY  10/04   Back surgery- lumbar decompression (01/2003)  . BREAST ENHANCEMENT SURGERY  1998  . 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 ORIF RADIAL FRACTURE Left 01/30/2017   Procedure: OPEN REDUCTION INTERNAL FIXATION (ORIF) RADIAL FRACTURE;  Surgeon: Corky Mull, MD;  Location: [facility] ORS;  Service: Orthopedics;  Laterality: Left;  . SHOULDER OPEN ROTATOR CUFF REPAIR  1994   left shoulder repair    There were no vitals filed for this visit.  Subjective Assessment - 05/23/17 1521    Subjective   I am using the bone stimulator about 20 min day - and I am working my arm  but was sore yesterday in hand and wrist - I do have RA - I did not do my exercises yet today - even do the paraffin every day -     Patient Stated Goals  I want to  be able to pick up things, turn my hand , driving, cut food ,  make jewelry ,  work in yard and ride my bike - get the thumb and fingers also more flexible    Currently in Pain?  No/denies       Assess AROM for wrist in all planes - pronation still 80 degrees  And grip was same on L  And strength WFL - pt to work on functional strength  But pt to can do hammer 16oz for pronation and supination - supported on pillow - pt was holding it up in air  To relax into stretch   Prior to stretch done Graston tool nr 2 for sweeping over volar wrist and forearm to increase ROM  With OT was able to get increase PROM for pronation  Pt to cont with HEP at home for 3 wks  And bone stimulator             OT Treatments/Exercises (OP) - 05/23/17 0001      Moist Heat Therapy   Number Minutes Moist Heat  8 Minutes    Moist Heat Location  Wrist in pronation stretch              OT Education -  05/23/17 1525    Education provided  Yes    Education Details  HEP review again - mostly pronation     Person(s) Educated  Patient    Methods  Explanation;Demonstration    Comprehension  Verbalized understanding;Returned demonstration       OT Short Term Goals - 04/18/17 1749      OT SHORT TERM GOAL #1   Title  Pt to be independent in HEP to increase L wrist in all planes to type on computer , use in bathing and dressing with no issues     Baseline  see flowsheet for ROM measurements     Time  2    Period  Weeks    Status  New    Target Date  05/02/17      OT SHORT TERM GOAL #2   Title  Pain on PRWHE improve with more than 8 points     Baseline  at eval pain on PRWHE was 13/50     Time  3    Period  Weeks    Status  New    Target Date  05/09/17        OT Long Term Goals - 04/18/17 1751      OT LONG TERM GOAL #1   Title  Pt L wrist strength increase to 4+/5 to use hand in using knife , push up out of bed , doing vacuuming without symptoms     Baseline  ROM decrease , strength 3/5    Time  6    Period  Weeks    Status  New    Target  Date  05/30/17      OT LONG TERM GOAL #2   Title  L grip strength increase with 5 lbs to be able to carry more than 8 lbs without symptoms     Baseline  Grip on L 35, R 55 lbs     Time  6    Period  Weeks    Status  New    Target Date  05/30/17      OT LONG TERM GOAL #3   Title  Function score on PRWHE improve more than 10 points     Baseline  Function score at eval 24.5/50    Time  6    Period  Weeks    Status  New    Target Date  05/30/17            Plan - 05/23/17 1527    Clinical Impression Statement  Pt cont to show decrease pronation at 80 degrees and grip stayed same - R hand grip was decrease but pt do have RA -and report overall arms feels weak and more ache with the rainy weather - pt to focus on pronation and review again with pt to keep elbow to side - pt has tendonsy to keep elbow away on armrest - pt to cont with HEP for 3 wks     Occupational performance deficits (Please refer to evaluation for details):  ADL's;IADL's;Play;Leisure    Rehab Potential  Good    OT Frequency  Biweekly    OT Duration  4 weeks    OT Treatment/Interventions  Fluidtherapy;Self-care/ADL training;Splinting;Therapeutic exercise;Scar mobilization;Passive range of motion;Manual Therapy;Paraffin    Plan  assess progress last 3 wk at home     Clinical Decision Making  Several treatment options, min-mod task modification necessary    OT Home Exercise Plan  see pt instruction  Consulted and Agree with Plan of Care  Patient       Patient will benefit from skilled therapeutic intervention in order to improve the following deficits and impairments:  Pain, Impaired flexibility, Decreased scar mobility, Decreased strength, Decreased range of motion, Impaired UE functional use  Visit Diagnosis: Stiffness of left wrist, not elsewhere classified  Muscle weakness (generalized)  Pain in left wrist  Scar condition and fibrosis of skin    Problem List Patient Active Problem List   Diagnosis  Date Noted  . Forearm fracture 01/29/2017  . Fall with injury 11/30/2015  . Elevated blood pressure (not hypertension) 11/30/2015  . Chronic pain syndrome 09/01/2014  . Heart palpitations 10/30/2013  . Routine general medical examination at a health care facility 06/28/2012  . Arthritis of hand 02/11/2010  . CONSTIPATION 04/05/2009  . Episodic mood disorder ([place]) 07/21/2008  . SCOLIOSIS 07/21/2008    Rosalyn Gess OTR/L,CLT 05/23/2017, 3:31 PM  [place] [facility] [address], [place], [address] Phone: [phone]   Fax:  [phone]  Name: Katherine [name] MRN: [id number] Date of Birth: 1950-12-12

## 2017-05-23 NOTE — Patient Instructions (Signed)
Pt to make sure her elbow to side during pronation stretch and PROM  Prayer stretch  And using hand functionally and increase gradually weight - but pain should be less than 1-2/10 Functional strengthening  And 16 oz hammer for pronation - keep forearm on pillow - support and relax with hammer stretch

## 2017-05-31 DIAGNOSIS — K219 Gastro-esophageal reflux disease without esophagitis: Secondary | ICD-10-CM | POA: Insufficient documentation

## 2017-06-13 ENCOUNTER — Ambulatory Visit: Payer: Medicare HMO | Admitting: Occupational Therapy

## 2017-06-20 ENCOUNTER — Ambulatory Visit: Payer: Medicare HMO | Attending: Student | Admitting: Occupational Therapy

## 2017-06-20 DIAGNOSIS — M6281 Muscle weakness (generalized): Secondary | ICD-10-CM

## 2017-06-20 DIAGNOSIS — M25632 Stiffness of left wrist, not elsewhere classified: Secondary | ICD-10-CM

## 2017-06-20 DIAGNOSIS — M25532 Pain in left wrist: Secondary | ICD-10-CM | POA: Insufficient documentation

## 2017-06-20 NOTE — Therapy (Signed)
[place] [facility] [address] [place], [address] Phone: [phone]   Fax:  [phone]  Occupational Therapy Treatment  Patient Details  Name: Katherine [name] MRN: [id number] Date of Birth: [birth year] Referring Provider: Roland Rack   Encounter Date: 06/20/2017  OT End of Session - 06/20/17 1446    Visit Number  6    Number of Visits  12    Date for OT Re-Evaluation  06/20/17    OT Start Time  1357    OT Stop Time  1431    OT Time Calculation (min)  34 min    Activity Tolerance  Patient tolerated treatment well    Behavior During Therapy  [facility] for tasks assessed/performed       Past Medical History:  Diagnosis Date  . Alcoholism ([place])   . Anxiety   . Arthritis    Rheumatoid   . Chronic back pain    followed by pain clinic  . Common bile duct dilatation   . Depression   . Retention of urine, unspecified   . Scoliosis (and kyphoscoliosis), idiopathic     Past Surgical History:  Procedure Laterality Date  . ABDOMINAL HYSTERECTOMY    . APPENDECTOMY    . BACK SURGERY  10/04   Back surgery- lumbar decompression (01/2003)  . BREAST ENHANCEMENT SURGERY  1998  . 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 ORIF RADIAL FRACTURE Left 01/30/2017   Procedure: OPEN REDUCTION INTERNAL FIXATION (ORIF) RADIAL FRACTURE;  Surgeon: Corky Mull, MD;  Location: [facility] ORS;  Service: Orthopedics;  Laterality: Left;  . SHOULDER OPEN ROTATOR CUFF REPAIR  1994   left shoulder repair    There were no vitals filed for this visit.  Subjective Assessment - 06/20/17 1444    Subjective   I think I am not doing the hammer exercises correct - when I am done my wrist hurts and then I still cannot get my palm all the [place] over - I am trying to get off one of my meds and if I am crying - it is just me being emotional     Patient Stated Goals  I want to  be able to pick up things, turn my  hand , driving, cut food , make jewelry ,  work in yard and ride my bike - get the thumb and fingers also more flexible    Currently in Pain?  No/denies         [facility] OT Assessment - 06/20/17 0001      AROM   Left Forearm Supination  90 Degrees    Left Wrist Extension  66 Degrees    Left Wrist Flexion  90 Degrees      Strength   Right Hand Grip (lbs)  50    Right Hand Lateral Pinch  14 lbs    Right Hand 3 Point Pinch  13 lbs    Left Hand Grip (lbs)  44    Left Hand Lateral Pinch  14 lbs    Left Hand 3 Point Pinch  14 lbs         Assess AROM for wrist in all planes - pronation still 80 degrees  And grip L hand did increase And strength WFL - pt to work on functional strength  Switch pt to 2 lbs weight for pronation - not hammer   and to do on  pillow - can assist with pronation stretch with weight   [facility] prolonged stretch for pronation in clinic - 3 x 1-2 min  And then 2 lbs weight  Also add table slide for wrist extnetion - 20 reps   had no pain with any of HEP update  With OT was able to get increase PROM for pronation  Pt to cont with HEP at home for 3 wks  And bone stimulator                OT Education - 06/20/17 1446    Education provided  Yes    Education Details  HEP updated     Person(s) Educated  Patient    Methods  Explanation;Demonstration;Tactile cues;Verbal cues;Handout    Comprehension  Verbalized understanding;Returned demonstration       OT Short Term Goals - 06/20/17 1449      OT SHORT TERM GOAL #1   Title  Pt to be independent in HEP to increase L wrist in all planes to type on computer , use in bathing and dressing with no issues     Baseline  see flowsheet for ROM measurements - progressing - pronation still 80     Time  3    Period  Weeks    Status  On-going    Target Date  07/11/17      OT SHORT TERM GOAL #2   Title  Pain on PRWHE improve with more than 8 points     Baseline  at eval pain on PRWHE was 13/50 - 7/50 now      Time  3    Period  Weeks    Status  On-going    Target Date  07/11/17        OT Long Term Goals - 06/20/17 1450      OT LONG TERM GOAL #1   Title  Pt L wrist strength increase to 4+/5 to use hand in using knife , push up out of bed , doing vacuuming without symptoms     Status  Achieved      OT LONG TERM GOAL #2   Title  L grip strength increase with 5 lbs to be able to carry more than 8 lbs without symptoms     Baseline  Grip on L 44, R 55 lbs     Status  Achieved      OT LONG TERM GOAL #3   Title  Function score on PRWHE improve more than 10 points     Baseline  Function score at eval 24.5/50 - report increase use - will reassess in 3 wks     Time  4    Period  Weeks    Status  On-going    Target Date  07/18/17            Plan - 06/20/17 1447    Clinical Impression Statement  Pt made progress in L grip strength , increase strength in wrist -and functional use - still limited in pronation at 80 degrees - did not had pain and report with weather wrist pain is better- review again with her pronation HEP and stretch - switch to 2 lbs weight - pt wants to follow up in 3 wks - she cont to report some numbness in L thumb and thenar eminence and clumsiness     Occupational performance deficits (Please refer to evaluation for details):  ADL's;IADL's;Play;Leisure    Rehab Potential  Good    OT  Frequency  -- in 3 wks     OT Duration  4 weeks    OT Treatment/Interventions  Fluidtherapy;Self-care/ADL training;Splinting;Therapeutic exercise;Scar mobilization;Passive range of motion;Manual Therapy;Paraffin    Plan  assess progress last 3 wk at home     Clinical Decision Making  Several treatment options, min-mod task modification necessary    OT Home Exercise Plan  see pt instruction    Consulted and Agree with Plan of Care  Patient       Patient will benefit from skilled therapeutic intervention in order to improve the following deficits and impairments:  Pain, Impaired  flexibility, Decreased scar mobility, Decreased strength, Decreased range of motion, Impaired UE functional use  Visit Diagnosis: Stiffness of left wrist, not elsewhere classified - Plan: Ot plan of care cert/re-cert  Muscle weakness (generalized) - Plan: Ot plan of care cert/re-cert  Pain in left wrist - Plan: Ot plan of care cert/re-cert    Problem List Patient Active Problem List   Diagnosis Date Noted  . Forearm fracture 01/29/2017  . Fall with injury 11/30/2015  . Elevated blood pressure (not hypertension) 11/30/2015  . Chronic pain syndrome 09/01/2014  . Heart palpitations 10/30/2013  . Routine general medical examination at a health care facility 06/28/2012  . Arthritis of hand 02/11/2010  . CONSTIPATION 04/05/2009  . Episodic mood disorder ([place]) 07/21/2008  . SCOLIOSIS 07/21/2008    Rosalyn Gess OTR/LCLT 06/20/2017, 2:53 PM  [place] [facility] [address] [place], [address] Phone: [phone]   Fax:  [phone]  Name: HAIVEN NARDONE MRN: [id number] Date of Birth: January 05, 1951

## 2017-06-20 NOTE — Patient Instructions (Signed)
Table slides for wrist extention  And supination prolonged stretch -3 x 1 min  And 2 lbs weight sup /pro supported on pillow - instead of hammer  And can help with pronation with stretch while doing 2 lbs weight

## 2017-07-19 ENCOUNTER — Ambulatory Visit: Payer: Medicare HMO | Attending: Student | Admitting: Occupational Therapy

## 2017-07-19 DIAGNOSIS — M6281 Muscle weakness (generalized): Secondary | ICD-10-CM | POA: Diagnosis present

## 2017-07-19 NOTE — Patient Instructions (Signed)
HEP provided green putty for gripping , lat and 3 point grip  And pulling and twisting with all digits  15 reps   2 x day   over 3 days increase to 2 sets   and then if no increase pain  Can increase to 3 sets but decrease to 1 x day   Red theraband for elbow extention and flexion   12 reps   and can also use it for shoulder exercises

## 2017-07-19 NOTE — Therapy (Signed)
[facility] [address], [place], [address] Phone: [phone]   Fax:  [phone]  Occupational Therapy Treatment  Patient Details  Name: Katherine [name] MRN: [id number] Date of Birth: 1950/12/07 Referring Provider: Roland Rack   Encounter Date: 07/19/2017  OT End of Session - 07/19/17 2051    Visit Number  7    Number of Visits  12    Date for OT Re-Evaluation  09/06/17    OT Start Time  1316    OT Stop Time  1345    OT Time Calculation (min)  29 min    Activity Tolerance  Patient tolerated treatment well    Behavior During Therapy  [facility] for tasks assessed/performed       Past Medical History:  Diagnosis Date  . Alcoholism ([place])   . Anxiety   . Arthritis    Rheumatoid   . Chronic back pain    followed by pain clinic  . Common bile duct dilatation   . Depression   . Retention of urine, unspecified   . Scoliosis (and kyphoscoliosis), idiopathic     Past Surgical History:  Procedure Laterality Date  . ABDOMINAL HYSTERECTOMY    . APPENDECTOMY    . BACK SURGERY  10/04   Back surgery- lumbar decompression (01/2003)  . BREAST ENHANCEMENT SURGERY  1998  . 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 ORIF RADIAL FRACTURE Left 01/30/2017   Procedure: OPEN REDUCTION INTERNAL FIXATION (ORIF) RADIAL FRACTURE;  Surgeon: Corky Mull, MD;  Location: [facility] ORS;  Service: Orthopedics;  Laterality: Left;  . SHOULDER OPEN ROTATOR CUFF REPAIR  1994   left shoulder repair    There were no vitals filed for this visit.  Subjective Assessment - 07/19/17 1316    Subjective   SOme days better than other days- cannot hold phone for while , thumb still numb on outside and dropping things - dexterity feels like is lagging -  I cannot do jewelry - I do cook  and drive not yet     Patient Stated Goals  I want to  be able to pick up things, turn my hand , driving, cut food , make  jewelry ,  work in yard and ride my bike - get the thumb and fingers also more flexible    Currently in Pain?  No/denies         [facility] OT Assessment - 07/19/17 0001      AROM   Left Forearm Pronation  80 Degrees    Left Forearm Supination  90 Degrees    Right Wrist Extension  70 Degrees    Right Wrist Flexion  90 Degrees    Left Wrist Extension  71 Degrees    Left Wrist Flexion  90 Degrees      Strength   Right Hand Grip (lbs)  50    Right Hand Lateral Pinch  15 lbs    Right Hand 3 Point Pinch  15 lbs    Left Hand Grip (lbs)  45    Left Hand Lateral Pinch  13 lbs    Left Hand 3 Point Pinch  14 lbs       AROM in wrist in all planes assess  And strength - wrist in  All planes 4+/5  Grip and prehension assess  appear pt has good strength for few reps but not act tolerance  upgrade HEP and review with pt    HEP provided green putty for gripping , lat and 3 point grip  And pulling and twisting with all digits  15 reps   2 x day   over 3 days increase to 2 sets   and then if no increase pain  Can increase to 3 sets but decrease to 1 x day   Red theraband for elbow extention and flexion   12 reps   and can also use it for shoulder exercises                 OT Education - 07/19/17 2051    Education provided  Yes    Education Details  HEP updated     Person(s) Educated  Patient    Methods  Explanation;Demonstration;Tactile cues    Comprehension  Verbalized understanding;Returned demonstration;Verbal cues required       OT Short Term Goals - 07/19/17 2055      OT SHORT TERM GOAL #1   Title  Pt to be independent in HEP to increase L wrist in all planes to type on computer , use in bathing and dressing with no issues     Status  Achieved      OT SHORT TERM GOAL #2   Title  Pain on PRWHE improve with more than 8 points     Baseline  at eval pain on PRWHE was 13/50 - 4/50 now     Status  Achieved        OT Long Term Goals - 07/19/17 2056      OT  LONG TERM GOAL #1   Title  Pt L wrist strength increase to 4+/5 to use hand in using knife , push up out of bed , doing vacuuming without symptoms     Status  Achieved      OT LONG TERM GOAL #2   Title  L grip strength increase with 5 lbs to be able to carry more than 8 lbs without symptoms     Baseline  Grip on L 44, R 55 lbs     Status  Achieved      OT LONG TERM GOAL #3   Title  Function score on PRWHE improve more than 10 points     Baseline  Function score at eval 24.5/50 - report increase use -but still issues with dropping objects , cannot hold phone for while ,and hard time making 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     Time  6    Period  Weeks    Status  On-going    Target Date  09/06/17            Plan - 07/19/17 2053    Clinical Impression Statement  Pt arrive this date after doing HEP for about month report she cannot hold phone for long time , drop objects and cannot do jewerly making - feel like L forearm and hand is weak - pt xtray showed excellent healing - this date pt strength and AROM except pronation shows great progress- did upgrade her strengthening to work on endurance  and act tolerance - will follow up in 2 wks again     Occupational performance deficits (Please refer to evaluation for details):  ADL's;IADL's;Play;Leisure    Rehab Potential  Good    OT Frequency  Biweekly    OT Duration  6 weeks    OT Treatment/Interventions  Fluidtherapy;Self-care/ADL training;Splinting;Therapeutic exercise;Scar mobilization;Passive range of motion;Manual  Therapy;Paraffin    Plan  upgrade strength and act tolerance     Clinical Decision Making  Limited treatment options, no task modification necessary    OT Home Exercise Plan  see pt instruction    Consulted and Agree with Plan of Care  Patient       Patient will benefit from skilled therapeutic intervention in order to improve the following deficits and impairments:  Pain, Impaired flexibility, Decreased scar mobility,  Decreased strength, Decreased range of motion, Impaired UE functional use  Visit Diagnosis: Muscle weakness (generalized) - Plan: Ot plan of care cert/re-cert    Problem List Patient Active Problem List   Diagnosis Date Noted  . Forearm fracture 01/29/2017  . Fall with injury 11/30/2015  . Elevated blood pressure (not hypertension) 11/30/2015  . Chronic pain syndrome 09/01/2014  . Heart palpitations 10/30/2013  . Routine general medical examination at a health care facility 06/28/2012  . Arthritis of hand 02/11/2010  . CONSTIPATION 04/05/2009  . Episodic mood disorder ([place]) 07/21/2008  . SCOLIOSIS 07/21/2008    Rosalyn Gess OTR/L,CLT 07/19/2017, 8:59 PM  [place] [facility] [address], [place], [address] Phone: [phone]   Fax:  [phone]  Name: MISTI TOWLE MRN: [id number] Date of Birth: April 24, 1950

## 2017-08-02 ENCOUNTER — Ambulatory Visit: Payer: Medicare HMO | Attending: Student | Admitting: Occupational Therapy

## 2017-08-02 DIAGNOSIS — M6281 Muscle weakness (generalized): Secondary | ICD-10-CM | POA: Insufficient documentation

## 2017-08-02 DIAGNOSIS — M25632 Stiffness of left wrist, not elsewhere classified: Secondary | ICD-10-CM | POA: Insufficient documentation

## 2017-08-17 ENCOUNTER — Ambulatory Visit: Payer: Medicare HMO | Admitting: Occupational Therapy

## 2017-08-17 DIAGNOSIS — M6281 Muscle weakness (generalized): Secondary | ICD-10-CM

## 2017-08-17 DIAGNOSIS — M25632 Stiffness of left wrist, not elsewhere classified: Secondary | ICD-10-CM | POA: Diagnosis present

## 2017-08-17 NOTE — Patient Instructions (Signed)
Same HEP but add scapula retraction and shoulder extention to HEP using RTB if not pain - and doing 3 sets can increase to green band

## 2017-08-17 NOTE — Therapy (Signed)
[place] [facility] [address] [place], [address] Phone: [phone]   Fax:  [phone]  Occupational Therapy Treatment  Patient Details  Name: Katherine [name] MRN: [id number] Date of Birth: Oct 18, 1950 Referring Provider: Roland Rack   Encounter Date: 08/17/2017  OT End of Session - 08/17/17 1416    Visit Number  8    Number of Visits  12    Date for OT Re-Evaluation  09/06/17    OT Start Time  1048    OT Stop Time  1128    OT Time Calculation (min)  40 min    Activity Tolerance  Patient tolerated treatment well    Behavior During Therapy  [facility] for tasks assessed/performed       Past Medical History:  Diagnosis Date  . Alcoholism ([place])   . Anxiety   . Arthritis    Rheumatoid   . Chronic back pain    followed by pain clinic  . Common bile duct dilatation   . Depression   . Retention of urine, unspecified   . Scoliosis (and kyphoscoliosis), idiopathic     Past Surgical History:  Procedure Laterality Date  . ABDOMINAL HYSTERECTOMY    . APPENDECTOMY    . BACK SURGERY  10/04   Back surgery- lumbar decompression (01/2003)  . BREAST ENHANCEMENT SURGERY  1998  . 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 ORIF RADIAL FRACTURE Left 01/30/2017   Procedure: OPEN REDUCTION INTERNAL FIXATION (ORIF) RADIAL FRACTURE;  Surgeon: Corky Mull, MD;  Location: [facility] ORS;  Service: Orthopedics;  Laterality: Left;  . SHOULDER OPEN ROTATOR CUFF REPAIR  1994   left shoulder repair    There were no vitals filed for this visit.  Subjective Assessment - 08/17/17 1128    Subjective   I can tell difference in my arm - I can hold phone now and the numbness is maybe little better in my thumb - I drop not things as much and my R hand is even better- was doing the  putty with my R hand too - still not doing jewlery to much -and still trying to get off my pain meds     Patient Stated  Goals  I want to  be able to pick up things, turn my hand , driving, cut food , make jewelry ,  work in yard and ride my bike - get the thumb and fingers also more flexible    Currently in Pain?  No/denies         [facility] OT Assessment - 08/17/17 0001      AROM   Left Forearm Pronation  85 Degrees      Strength   Right Hand Grip (lbs)  50    Right Hand Lateral Pinch  18 lbs    Right Hand 3 Point Pinch  18 lbs    Left Hand Grip (lbs)  48    Left Hand Lateral Pinch  16 lbs    Left Hand 3 Point Pinch  14 lbs         AROM in wrist in all planes assess  And strength - wrist in  All planes 5/5 except sup and pronation  And triceps L 4-/5    Grip and prehension assess  pt to cont with  HEP  green putty for gripping , lat and 3 point grip  And pulling and twisting  with all digits  15 reps   2 x day    Red theraband for elbow extention and flexion   12 reps   review pt did not do it  And add some scapula retraction and shoulder extention  RTB   to increase gradually over few  wks to 3 sets of 10 and then can go to green band if no pain   and can also use it for shoulder exercises                OT Education - 08/17/17 1414    Education provided  Yes    Education Details  Review HEP and add scapula strengthening - and discuss progress    Person(s) Educated  Patient    Methods  Explanation;Demonstration    Comprehension  Verbalized understanding;Returned demonstration       OT Short Term Goals - 07/19/17 2055      OT SHORT TERM GOAL #1   Title  Pt to be independent in HEP to increase L wrist in all planes to type on computer , use in bathing and dressing with no issues     Status  Achieved      OT SHORT TERM GOAL #2   Title  Pain on PRWHE improve with more than 8 points     Baseline  at eval pain on PRWHE was 13/50 - 4/50 now     Status  Achieved        OT Long Term Goals - 07/19/17 2056      OT LONG TERM GOAL #1   Title  Pt L wrist strength  increase to 4+/5 to use hand in using knife , push up out of bed , doing vacuuming without symptoms     Status  Achieved      OT LONG TERM GOAL #2   Title  L grip strength increase with 5 lbs to be able to carry more than 8 lbs without symptoms     Baseline  Grip on L 44, R 55 lbs     Status  Achieved      OT LONG TERM GOAL #3   Title  Function score on PRWHE improve more than 10 points     Baseline  Function score at eval 24.5/50 - report increase use -but still issues with dropping objects , cannot hold phone for while ,and hard time making 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     Time  6    Period  Weeks    Status  On-going    Target Date  09/06/17            Plan - 08/17/17 1417    Clinical Impression Statement  Pt worked the last month on some pronation stretches , weight for wrist and putty HEP - showed increase grip and prehension and report that she feels like her hands and L arm is stronger - but still feel heavy - pt report she did not use her band HEP - review again with pt because tricep was weak and add some scapula strengthening for her to do for about 6 wks and follow up me in middle July if needed     Occupational performance deficits (Please refer to evaluation for details):  ADL's;IADL's;Play;Leisure    Rehab Potential  Good    OT Frequency  -- 6 wks     OT Duration  6 weeks    OT Treatment/Interventions  Fluidtherapy;Self-care/ADL training;Splinting;Therapeutic exercise;Scar mobilization;Passive  range of motion;Manual Therapy;Paraffin    Plan  REassess progress with her HEP and upgrade strength and act tolerance if needed     Clinical Decision Making  Limited treatment options, no task modification necessary    OT Home Exercise Plan  see pt instruction    Consulted and Agree with Plan of Care  Patient       Patient will benefit from skilled therapeutic intervention in order to improve the following deficits and impairments:  Pain, Impaired flexibility, Decreased scar  mobility, Decreased strength, Decreased range of motion, Impaired UE functional use  Visit Diagnosis: Muscle weakness (generalized)  Stiffness of left wrist, not elsewhere classified    Problem List Patient Active Problem List   Diagnosis Date Noted  . Forearm fracture 01/29/2017  . Fall with injury 11/30/2015  . Elevated blood pressure (not hypertension) 11/30/2015  . Chronic pain syndrome 09/01/2014  . Heart palpitations 10/30/2013  . Routine general medical examination at a health care facility 06/28/2012  . Arthritis of hand 02/11/2010  . CONSTIPATION 04/05/2009  . Episodic mood disorder ([place]) 07/21/2008  . SCOLIOSIS 07/21/2008    Rosalyn Gess OTR/L,CLT 08/17/2017, 2:21 PM  [place] [address] [place], [address] Phone: [phone]   Fax:  [phone]  Name: GEORGETTE HELMER MRN: [id number] Date of Birth: 1950/05/12

## 2017-10-19 ENCOUNTER — Ambulatory Visit: Payer: Medicare HMO | Attending: Student | Admitting: Occupational Therapy

## 2017-10-19 DIAGNOSIS — M25632 Stiffness of left wrist, not elsewhere classified: Secondary | ICD-10-CM | POA: Diagnosis present

## 2017-10-19 DIAGNOSIS — M6281 Muscle weakness (generalized): Secondary | ICD-10-CM | POA: Insufficient documentation

## 2017-10-19 DIAGNOSIS — M25532 Pain in left wrist: Secondary | ICD-10-CM

## 2017-10-19 NOTE — Therapy (Signed)
[place] [facility] [address] [place], [address] Phone: [phone]   Fax:  [phone]  Occupational Therapy Treatment/discharge  Patient Details  Name: Katherine [name] MRN: [id number] Date of Birth: 1950-05-19 Referring Provider: Roland Rack   Encounter Date: 10/19/2017  OT End of Session - 10/19/17 1111    Visit Number  9    Number of Visits  9    Date for OT Re-Evaluation  10/19/17    OT Start Time  1044    OT Stop Time  1104    OT Time Calculation (min)  20 min    Activity Tolerance  Patient tolerated treatment well    Behavior During Therapy  [facility] for tasks assessed/performed       Past Medical History:  Diagnosis Date  . Alcoholism ([place])   . Anxiety   . Arthritis    Rheumatoid   . Chronic back pain    followed by pain clinic  . Common bile duct dilatation   . Depression   . Retention of urine, unspecified   . Scoliosis (and kyphoscoliosis), idiopathic     Past Surgical History:  Procedure Laterality Date  . ABDOMINAL HYSTERECTOMY    . APPENDECTOMY    . BACK SURGERY  10/04   Back surgery- lumbar decompression (01/2003)  . BREAST ENHANCEMENT SURGERY  1998  . 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 ORIF RADIAL FRACTURE Left 01/30/2017   Procedure: OPEN REDUCTION INTERNAL FIXATION (ORIF) RADIAL FRACTURE;  Surgeon: Corky Mull, MD;  Location: [facility] ORS;  Service: Orthopedics;  Laterality: Left;  . SHOULDER OPEN ROTATOR CUFF REPAIR  1994   left shoulder repair    There were no vitals filed for this visit.  Subjective Assessment - 10/19/17 1109    Subjective   I am now off my pain meds- and feel really strong and built up my muscle again - I had to stop the putty and bands because my arthritis acted up with this humidity - but I am in yard doing things , walking 2 miles an dancing     Patient Stated Goals  I want to  be able to pick up things, turn  my hand , driving, cut food , make jewelry ,  work in yard and ride my bike - get the thumb and fingers also more flexible    Currently in Pain?  No/denies         [facility] OT Assessment - 10/19/17 0001      Strength   Right Hand Grip (lbs)  52    Right Hand Lateral Pinch  18 lbs    Right Hand 3 Point Pinch  20 lbs    Left Hand Grip (lbs)  50    Left Hand Lateral Pinch  16 lbs    Left Hand 3 Point Pinch  16 lbs           AROM in wrist in all planes assess  And strength - wrist in All planes 5/5 except  pronation  End range  And triceps L 4+/5    Grip and prehension assess Above range for her age -  Pt arthritis acted up with humidity - pt to only  Do functional strength No theraband or putty anymore               OT Education - 10/19/17 1110    Education  provided  Yes    Education Details  progress and cont only functional strengthening     Person(s) Educated  Patient    Methods  Explanation    Comprehension  Verbalized understanding       OT Short Term Goals - 10/19/17 1115      OT SHORT TERM GOAL #1   Title  Pt to be independent in HEP to increase L wrist in all planes to type on computer , use in bathing and dressing with no issues     Status  Achieved      OT SHORT TERM GOAL #2   Title  Pain on PRWHE improve with more than 8 points     Status  Achieved        OT Long Term Goals - 10/19/17 1114      OT LONG TERM GOAL #1   Title  Pt L wrist strength increase to 4+/5 to use hand in using knife , push up out of bed , doing vacuuming without symptoms     Status  Achieved      OT LONG TERM GOAL #2   Title  L grip strength increase with 5 lbs to be able to carry more than 8 lbs without symptoms     Baseline  Grip R 52, L 50     Status  Achieved      OT LONG TERM GOAL #3   Title  Function score on PRWHE improve more than 10 points     Status  Achieved            Plan - 10/19/17 1111    Clinical Impression Statement  Pt was seen 2  months ago - done putty and theraband - pt show increase grip and prehension strength -above average for her age - AROM WNL - strength at wrist WNL except end range pronation - pt discharge from OT with only functional strengthening     Consulted and Agree with Plan of Care  Patient       Patient will benefit from skilled therapeutic intervention in order to improve the following deficits and impairments:     Visit Diagnosis: Muscle weakness (generalized) - Plan: Ot plan of care cert/re-cert  Stiffness of left wrist, not elsewhere classified - Plan: Ot plan of care cert/re-cert  Pain in left wrist - Plan: Ot plan of care cert/re-cert    Problem List Patient Active Problem List   Diagnosis Date Noted  . Forearm fracture 01/29/2017  . Fall with injury 11/30/2015  . Elevated blood pressure (not hypertension) 11/30/2015  . Chronic pain syndrome 09/01/2014  . Heart palpitations 10/30/2013  . Routine general medical examination at a health care facility 06/28/2012  . Arthritis of hand 02/11/2010  . CONSTIPATION 04/05/2009  . Episodic mood disorder ([place]) 07/21/2008  . SCOLIOSIS 07/21/2008    Rosalyn Gess OTR/L,CLT 10/19/2017, 11:17 AM  [place] [facility] [address] [place], [address] Phone: [phone]   Fax:  [phone]  Name: Katherine [name] MRN: [id number] Date of Birth: [birth year]

## 2018-12-12 ENCOUNTER — Other Ambulatory Visit: Payer: Self-pay

## 2018-12-12 ENCOUNTER — Ambulatory Visit: Payer: Medicare HMO | Admitting: Psychiatry

## 2019-01-08 ENCOUNTER — Encounter: Payer: Self-pay | Admitting: Psychiatry

## 2019-01-08 ENCOUNTER — Other Ambulatory Visit: Payer: Self-pay

## 2019-01-08 ENCOUNTER — Ambulatory Visit (INDEPENDENT_AMBULATORY_CARE_PROVIDER_SITE_OTHER): Payer: Medicare HMO | Admitting: Psychiatry

## 2019-01-08 DIAGNOSIS — F1021 Alcohol dependence, in remission: Secondary | ICD-10-CM | POA: Diagnosis not present

## 2019-01-08 DIAGNOSIS — F411 Generalized anxiety disorder: Secondary | ICD-10-CM

## 2019-01-08 DIAGNOSIS — Z8659 Personal history of other mental and behavioral disorders: Secondary | ICD-10-CM | POA: Insufficient documentation

## 2019-01-08 MED ORDER — VIIBRYD 40 MG PO TABS: 20.0000 mg | ORAL_TABLET | Freq: Every day | ORAL | 1 refills | Status: DC
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
## 2019-01-16 ENCOUNTER — Telehealth: Payer: Self-pay | Admitting: Psychiatry

## 2019-01-16 NOTE — Telephone Encounter (Signed)
Received fax message from Ms. Miguel Dibble to whom the patient was referred for therapy that she is not currently accepting new patients.  Call patient and left voicemail.  Provided information for Ms. Dwyane Dee at [phone].  Also advised her to call her health insurance plan to get a list of therapist in the community.  It is important that she establish care with a therapist as soon as possible.

## 2019-04-09 ENCOUNTER — Ambulatory Visit (INDEPENDENT_AMBULATORY_CARE_PROVIDER_SITE_OTHER): Payer: Medicare HMO | Admitting: Psychiatry

## 2019-04-09 ENCOUNTER — Other Ambulatory Visit: Payer: Self-pay

## 2019-04-09 ENCOUNTER — Encounter: Payer: Self-pay | Admitting: Psychiatry

## 2019-04-09 DIAGNOSIS — F1021 Alcohol dependence, in remission: Secondary | ICD-10-CM | POA: Diagnosis not present

## 2019-04-09 DIAGNOSIS — Z8659 Personal history of other mental and behavioral disorders: Secondary | ICD-10-CM | POA: Diagnosis not present

## 2019-04-09 DIAGNOSIS — F411 Generalized anxiety disorder: Secondary | ICD-10-CM

## 2019-04-09 MED ORDER — CLONAZEPAM 0.5 MG PO TABS: 0.2500 mg | ORAL_TABLET | Freq: Every evening | ORAL | 0 refills | Status: DC | PRN

## 2019-04-09 NOTE — Progress Notes (Signed)
Virtual Visit via Video Note  I connected with Katherine [name] on 04/09/19 at  2:00 PM EST by a video enabled telemedicine application and verified that I am speaking with the correct person using two identifiers.   I discussed the limitations of evaluation and management by telemedicine and the availability of in person appointments. The patient expressed understanding and agreed to proceed.    I discussed the assessment and treatment plan with the patient. The patient was provided an opportunity to ask questions and all were answered. The patient agreed with the plan and demonstrated an understanding of the instructions.   The patient was advised to call back or seek an in-person evaluation if the symptoms worsen or if the condition fails to improve as anticipated.   Adena MD OP Progress Note  04/09/2019 3:43 PM GER TEIG  MRN:  [id number]  Chief Complaint:  Chief Complaint    Follow-up     HPI: Katherine [name] is a 69 year old Caucasian female, currently separated from her husband, lives in [place], on disability, has a history of anxiety disorder, history of depression, alcohol use disorder in remission, opioid use dependence in controlled environment, chronic pain, osteoporosis, lumbar radiculopathy, rheumatoid arthritis, gastroesophageal reflux disease was evaluated by telemedicine today.  Patient today reports she is currently doing well on the current medication regimen.  She denies any significant depressive symptoms.  She does report some situational anxiety about having some problems with her Internet connection today.  She however reports overall her mood symptoms are stable on the Viibryd.  Patient reports she continues to take Klonopin 0.25 mg at night.  Although she is prescribed 0.5 mg she takes only half tablet.  She reports she recently was told by her previous provider at the pain clinic that her Klonopin will not be continued by her.  She hence wonders whether writer would be  able to prescribe it to her.  Patient reports she never made contact with the therapist as recommended by writer last visit.  She reports her anxiety symptoms are under control and she does not think she needs to be in therapy at this time.  She also reports financial problems since she is on disability income.  Patient reports she continues to follow-up with AA meetings-2 times a week.  She reports she also has a woman's meeting going on which is very helpful.  Patient denies any suicidality, homicidality or perceptual disturbances.  Patient denies any other concerns today.   Visit Diagnosis:    ICD-10-CM   1. GAD (generalized anxiety disorder)  F41.1 clonazePAM (KLONOPIN) 0.5 MG tablet  2. Hx of major depression  Z86.59   3. Alcohol use disorder, moderate, in sustained remission ([place])  F10.21     Past Psychiatric History: I have reviewed past psychiatric history from my progress note on 01/08/2019.  Past trials of Neurontin, Lyrica, Cymbalta, Prozac, Effexor, Zoloft, Wellbutrin, Lamictal, Paxil and several other medications that she does not remember names of.  Past Medical History:  Past Medical History:  Diagnosis Date  . Alcoholism ([place])   . Anxiety   . Arthritis    Rheumatoid   . Chronic back pain    followed by pain clinic  . Common bile duct dilatation   . Depression   . Osteoporosis   . Retention of urine, unspecified   . Scoliosis (and kyphoscoliosis), idiopathic     Past Surgical History:  Procedure Laterality Date  . ABDOMINAL HYSTERECTOMY    . APPENDECTOMY    .  BACK SURGERY  10/04   Back surgery- lumbar decompression (01/2003)  . BREAST ENHANCEMENT SURGERY  1998  . 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 ORIF RADIAL FRACTURE Left 01/30/2017   Procedure: OPEN REDUCTION INTERNAL FIXATION (ORIF) RADIAL FRACTURE;  Surgeon: Corky Mull, MD;  Location: [facility] ORS;  Service: Orthopedics;  Laterality: Left;  . SHOULDER  OPEN ROTATOR CUFF REPAIR  1994   left shoulder repair    Family Psychiatric History: I have reviewed family psychiatric history from my progress note on 01/08/2019.  Family History:  Family History  Problem Relation Age of Onset  . Diabetes Mother   . Hypertension Father   . Alcohol abuse Brother   . Alcohol abuse Maternal Grandfather   . Drug abuse Maternal Grandfather     Social History: Reviewed social history from my progress note on 01/08/2019. Social History   Socioeconomic History  . Marital status: Married    Spouse name: Not on file  . Number of children: Not on file  . Years of education: Not on file  . Highest education level: Not on file  Occupational History  . Occupation: disabled- worked part-time at [employer] at [place] Use  . Smoking status: Former Smoker    Types: Cigarettes    Quit date: 06/21/2010    Years since quitting: 8.8  . Smokeless tobacco: Never Used  Substance and Sexual Activity  . Alcohol use: No  . Drug use: Not on file  . Sexual activity: Not on file  Other Topics Concern  . Not on file  Social History Narrative  . Not on file   Social Determinants of Health   Financial Resource Strain:   . Difficulty of Paying Living Expenses: Not on file  Food Insecurity:   . Worried About Charity fundraiser in the Last Year: Not on file  . Ran Out of Food in the Last Year: Not on file  Transportation Needs:   . Lack of Transportation (Medical): Not on file  . Lack of Transportation (Non-Medical): Not on file  Physical Activity:   . Days of Exercise per Week: Not on file  . Minutes of Exercise per Session: Not on file  Stress:   . Feeling of Stress : Not on file  Social Connections:   . Frequency of Communication with Friends and Family: Not on file  . Frequency of Social Gatherings with Friends and Family: Not on file  . Attends Religious Services: Not on file  . Active Member of Clubs or Organizations: Not on file  . Attends Theatre manager Meetings: Not on file  . Marital Status: Not on file    Allergies:  Allergies  Allergen Reactions  . Duloxetine Hcl Other (See Comments)    Other Reaction: drwsiness;unable to walk  . Sulfonamide Derivatives     Metabolic Disorder Labs: No results found for: HGBA1C, MPG No results found for: PROLACTIN Lab Results  Component Value Date   CHOL 196 06/28/2012   TRIG 93 06/28/2012   HDL 78 06/28/2012   CHOLHDL 2.5 06/28/2012   VLDL 19 06/28/2012   LDLCALC 99 06/28/2012   Lab Results  Component Value Date   TSH 0.95 10/30/2013   TSH 1.841 06/28/2012    Therapeutic Level Labs: No results found for: LITHIUM No results found for: VALPROATE No components found for:  CBMZ  Current Medications: Current Outpatient Medications  Medication Sig Dispense  Refill  . Calcium Carbonate Antacid (CALCIUM CARBONATE, DOSED IN MG ELEMENTAL CALCIUM,) 1250 MG/5ML SUSP Take by mouth.    . clonazePAM (KLONOPIN) 0.5 MG tablet Take 0.5 tablets (0.25 mg total) by mouth at bedtime as needed for anxiety. Do not take at bedtime 30 tablet 0  . [facility] D3 2000 units CAPS Take 2,000 Units by mouth daily.  11  . cyanocobalamin (,VITAMIN B-12,) 1000 MCG/ML injection Inject 1,000 mcg into the muscle every 30 (thirty) days.    . cyclobenzaprine (FLEXERIL) 10 MG tablet cyclobenzaprine 10 mg tablet  TAKE 1 TABLET BY MOUTH EVERYDAY AT BEDTIME    . cyclobenzaprine (FLEXERIL) 10 MG tablet TAKE 1 TABLET BY MOUTH EVERYDAY AT BEDTIME    . denosumab (PROLIA) 60 MG/ML SOSY injection Prolia 60 mg/mL subcutaneous syringe    . docusate sodium (COLACE) 100 MG capsule Take 100 mg by mouth daily.    . fluticasone (FLONASE) 50 MCG/ACT nasal spray Place 1-2 sprays into both nostrils daily.  0  . FOLIC ACID PO Take 15 mg by mouth daily.    . methadone (DOLOPHINE) 5 MG tablet methadone 5 mg tablet  TAKE 1 1.5 TABLETS (5 7.5 MG TOTAL) BY MOUTH ONCE DAILY    . methadone (DOLOPHINE) 5 MG tablet TAKE 1 1.5 TABLETS (5 7.5  MG TOTAL) BY MOUTH ONCE DAILY    . naloxone (NARCAN) nasal spray 4 mg/0.1 mL Place into the nose.    . polyethylene glycol powder (GLYCOLAX/MIRALAX) powder MIX 17 GRAMS IN LIQUID AND DRINK DAILY. 527 g 10  . traMADol (ULTRAM) 50 MG tablet Take 1-2 tablets by mouth every 6 (six) hours as needed for pain.    . Vilazodone HCl (VIIBRYD) 40 MG TABS Take 0.5-1 tablets (20-40 mg total) by mouth daily. 90 tablet 1  . Vitamin D, Ergocalciferol, (DRISDOL) 50000 units CAPS capsule Take 50,000 Units by mouth every 7 (seven) days.  5  . methylPREDNISolone (MEDROL DOSEPAK) 4 MG TBPK tablet TAKE 1 DOSE PK(S) BY ORAL ROUTE.    Marland Kitchen oxyCODONE (OXY IR/ROXICODONE) 5 MG immediate release tablet Take 1-2 tablets (5-10 mg total) by mouth every 4 (four) hours as needed for moderate pain ((score 4 to 6)). (Patient not taking: Reported on 04/09/2019) 60 tablet 0  . promethazine (PHENERGAN) 25 MG tablet promethazine 25 mg tablet  TAKE 1 TABLET BY MOUTH EVERY 6 HOURS AS NEEDED FOR NAUSEA AND VOMITING     No current facility-administered medications for this visit.     Musculoskeletal: Strength & Muscle Tone: UTA Gait & Station: normal Patient leans: N/A  Psychiatric Specialty Exam: Review of Systems  Psychiatric/Behavioral: Negative for sleep disturbance and suicidal ideas. The patient is nervous/anxious.   All other systems reviewed and are negative.   There were no vitals taken for this visit.There is no height or weight on file to calculate BMI.  General Appearance: Casual  Eye Contact:  Fair  Speech:  Normal Rate  Volume:  Normal  Mood:  Anxious  Affect:  Congruent  Thought Process:  Goal Directed and Descriptions of Associations: Intact  Orientation:  Full (Time, Place, and Person)  Thought Content: Logical   Suicidal Thoughts:  No  Homicidal Thoughts:  No  Memory:  Immediate;   Fair Recent;   Fair Remote;   Fair  Judgement:  Fair  Insight:  Fair  Psychomotor Activity:  Normal  Concentration:   Concentration: Fair and Attention Span: Fair  Recall:  [employer] of Knowledge: Fair  Language: Fair  Akathisia:  No  Handed:  Right  AIMS (if indicated): UTA  Assets:  Communication Skills Desire for Improvement Housing  ADL's:  Intact  Cognition: WNL  Sleep:  Fair   Screenings: GAD-7     Office Visit from 01/08/2019 in [facility]  Total GAD-7 Score  6       Assessment and Plan: Dalin is a 69 year old Caucasian female on disability, separated from husband, lives in [place], has a history of anxiety disorder, depression, currently in remission, chronic pain, rheumatoid arthritis, osteoporosis, multiple other medical problems was evaluated by telemedicine today.  She is biologically predisposed given her chronic pain as well as history of trauma.  Patient also has a history of substance abuse problems although she currently denies it.  Patient reports mood symptoms is currently stable and declines psychotherapy sessions.  Discussed plan as noted below.  Plan GAD-stable Viibryd 20 mg p.o. daily Patient is on Klonopin 0.25 mg p.o. nightly.  This prescription was provided by her previous provider at the pain clinic. Patient however today reports she wants writer to start prescribing it to her since her pain clinic will not provide to her. Writer had a lengthy conversation with patient-discussed with her the long-term effect of benzodiazepine therapy including its effect on her cognitive function as well as drug to drug interaction in combination with medications like methadone.  Patient reports she used to be on a higher dosage of Klonopin previously and currently takes only 0.25 mg.  She reports even though it was hard for her to come off of it she has been able to do it and is happy about that.  Patient reports she will be willing to come off of the Klonopin after the winter months.  She agrees that she will work with provider in tapering it off. Discussed  with patient that if her anxiety symptoms returns or if she has any struggles coming off of the Klonopin she may benefit from medication readjustment, adding nonaddictive medications like hydroxyzine or propranolol as needed or referral for psychotherapy sessions.  History of depression-currently appears to be in remission  Alcohol use disorder in sustained remission Patient continues to attend AA meetings. She has been sober since the past 20 years.   Follow-up in clinic in 3 months or sooner if needed.  April 7 at 2 PM  I have spent atleast 20 minutes non face to face with patient today. More than 50 % of the time was spent for ordering medications and test ,psychoeducation and supportive psychotherapy and care coordination,as well as documenting clinical information in electronic health record. This note was generated in part or whole with voice recognition software. Voice recognition is usually quite accurate but there are transcription errors that can and very often do occur. I apologize for any typographical errors that were not detected and corrected.        Ursula Alert, MD 04/09/2019, 3:43 PM

## 2019-06-04 ENCOUNTER — Telehealth: Payer: Self-pay

## 2019-06-04 NOTE — Telephone Encounter (Signed)
Patient called requesting a refill on her Clonazepam 0.5mg  to be [facility] [address] in [place]. She stated she will be out on Friday. She has a scheduled appointment on 07/09/19. Thank you.

## 2019-06-05 ENCOUNTER — Telehealth: Payer: Self-pay

## 2019-06-05 DIAGNOSIS — F411 Generalized anxiety disorder: Secondary | ICD-10-CM

## 2019-06-05 MED ORDER — CLONAZEPAM 0.5 MG PO TABS: 0.2500 mg | ORAL_TABLET | Freq: Every evening | ORAL | 0 refills | Status: DC | PRN

## 2019-06-05 NOTE — Telephone Encounter (Signed)
Notified patient.

## 2019-06-05 NOTE — Telephone Encounter (Signed)
Patient called requesting a refill on her Clonazepam 0.5mg  to [facility] to [facility] [address] in [place]. Thank you.

## 2019-06-05 NOTE — Telephone Encounter (Signed)
I have sent clonazepam 0.5 milligrams-take half tablet as needed for anxiety-verified [place] controlled substance database.

## 2019-06-06 ENCOUNTER — Telehealth: Payer: Self-pay | Admitting: Psychiatry

## 2019-06-06 NOTE — Telephone Encounter (Signed)
Received an email from: Behavioral health outpatient in my in box that patient had called with concerns about her Klonopin prescription as well as her next appointment time. Writer attempted to contact patient, left a voicemail discussing that her Klonopin prescription was sent to the pharmacy yesterday and that she needs to contact the front desk if she has concerns about her next appointment time.

## 2019-07-09 ENCOUNTER — Encounter: Payer: Self-pay | Admitting: Psychiatry

## 2019-07-09 ENCOUNTER — Ambulatory Visit (INDEPENDENT_AMBULATORY_CARE_PROVIDER_SITE_OTHER): Payer: Medicare HMO | Admitting: Psychiatry

## 2019-07-09 ENCOUNTER — Other Ambulatory Visit: Payer: Self-pay

## 2019-07-09 DIAGNOSIS — Z9111 Patient's noncompliance with dietary regimen: Secondary | ICD-10-CM

## 2019-07-09 DIAGNOSIS — F411 Generalized anxiety disorder: Secondary | ICD-10-CM

## 2019-07-09 DIAGNOSIS — F1021 Alcohol dependence, in remission: Secondary | ICD-10-CM | POA: Diagnosis not present

## 2019-07-09 DIAGNOSIS — Z8659 Personal history of other mental and behavioral disorders: Secondary | ICD-10-CM | POA: Diagnosis not present

## 2019-07-09 DIAGNOSIS — Z91199 Patient's noncompliance with other medical treatment and regimen due to unspecified reason: Secondary | ICD-10-CM | POA: Insufficient documentation

## 2019-07-09 MED ORDER — BUSPIRONE HCL 10 MG PO TABS: 5.0000 mg | ORAL_TABLET | Freq: Two times a day (BID) | ORAL | 1 refills | Status: DC

## 2019-07-09 MED ORDER — VIIBRYD 40 MG PO TABS: 20.0000 mg | ORAL_TABLET | Freq: Every day | ORAL | 0 refills | Status: DC

## 2019-07-09 MED ORDER — CLONAZEPAM 0.5 MG PO TABS: 0.2500 mg | ORAL_TABLET | Freq: Every day | ORAL | 0 refills | Status: DC | PRN

## 2019-07-09 NOTE — Progress Notes (Signed)
Provider Location : [facility] Patient Location : Home  Virtual Visit via Video Note  I connected with Katherine [name] on 07/09/19 at  2:00 PM EDT by a video enabled telemedicine application and verified that I am speaking with the correct person using two identifiers.   I discussed the limitations of evaluation and management by telemedicine and the availability of in person appointments. The patient expressed understanding and agreed to proceed.     I discussed the assessment and treatment plan with the patient. The patient was provided an opportunity to ask questions and all were answered. The patient agreed with the plan and demonstrated an understanding of the instructions.   The patient was advised to call back or seek an in-person evaluation if the symptoms worsen or if the condition fails to improve as anticipated.   [facility] MD OP Progress Note  07/09/2019 5:33 PM Katherine [name]  MRN:  [id number]  Chief Complaint:  Chief Complaint    Follow-up     HPI: Katherine [name] is a 69 year old Caucasian female, currently separated from her husband, lives in [place], on disability, has a history of anxiety disorder, history of depression, alcohol use disorder in remission, opioid use dependence in controlled environment, chronic pain, osteoporosis, lumbar radiculopathy, rheumatoid arthritis , gastroesophageal reflux disease was evaluated by telemedicine today.  Patient today in session appeared to be agitated and upset.  Patient reported that she was having a lot of back pain.  She is currently on opioid medications however she continues to have uncontrolled pain.  She reports she is currently following up with her providers for pain management.  She reports she may have to go back and start a dosage of prednisone.  Patient reports she continues to struggle with anxiety.  She has several psychosocial stressors including the pandemic, her pain.  She reports she currently takes Klonopin 0.25 mg daily.  She  usually takes it in the afternoon.  Her previous provider used to give her 60 tablets for a month even though she was only taking a smaller quantity.  She reports she was given 60 tablets for a month because that made it cheaper for her.   Writer reminded patient that she had initially reported to Probation officer that she was willing to come off of Klonopin after the winter months.  Patient initially agreed to the same.  When writer recommended that since she is on a very low dosage of 0.25 mg she can skip the Klonopin dosage once a week this week and then twice a week next week and continue to cut back gradually patient became upset.  She reported that writer does not know anything about mental health or anxiety or her pain. She reported that her anxiety is uncontrollable and it is hard for her to come off of Klonopin this soon.  At this writer discussed with patient that if her anxiety is uncontrollable she can be started on new medications like an SSRI or SNRI  and she will need psychotherapy sessions may be on a more intensive level .  Patient reported that she herself went to school and knows a lot about therapy herself.  She does not believe therapy is going to be helpful.  She also reported that she cannot afford any kind of psychotherapy sessions.  Writer discussed with patient that there are community clinics which will make it more affordable for her.  She could also call her health insurance plan to get a list of therapist whom she will be  able to afford.  Discussed with her again that medications in combination with psychotherapy sessions will help her to control her anxiety better.  Patient however was very resistant and appeared to be more agitated and angry.  When writer attempted to educate patient about the risk of combining benzodiazepines with opioid medications patient became upset and reported that she is on a very low dosage of methadone and that is okay to combine them together.  Since at the  beginning of the appointment patient had discussed with writer about possibly starting a medication like BuSpar, Probation officer discussed that with patient again.  Provided her medication education.  Discussed with her that she can be started on a low dosage of BuSpar.  However once writer sent the BuSpar to the pharmacy patient reported that she does not even want to be on the BuSpar and that she will find someone else to manage her medications.     visit Diagnosis:    ICD-10-CM   1. GAD (generalized anxiety disorder)  F41.1 busPIRone (BUSPAR) 10 MG tablet    clonazePAM (KLONOPIN) 0.5 MG tablet    Vilazodone HCl (VIIBRYD) 40 MG TABS  2. Noncompliance with treatment plan  Z91.11   3. Hx of major depression  Z86.59   4. Alcohol use disorder, moderate, in sustained remission ([place])  F10.21     Past Psychiatric History: I have reviewed past psychiatric history from my progress note on 01/08/2019.  Past trials of Neurontin, Lyrica, Cymbalta, Prozac, Effexor, Zoloft, Wellbutrin, Lamictal, Paxil and several other medications that she does not remember names of.  Past Medical History:  Past Medical History:  Diagnosis Date  . Alcoholism ([place])   . Anxiety   . Arthritis    Rheumatoid   . Chronic back pain    followed by pain clinic  . Common bile duct dilatation   . Depression   . Osteoporosis   . Retention of urine, unspecified   . Scoliosis (and kyphoscoliosis), idiopathic     Past Surgical History:  Procedure Laterality Date  . ABDOMINAL HYSTERECTOMY    . APPENDECTOMY    . BACK SURGERY  10/04   Back surgery- lumbar decompression (01/2003)  . BREAST ENHANCEMENT SURGERY  1998  . 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 ORIF RADIAL FRACTURE Left 01/30/2017   Procedure: OPEN REDUCTION INTERNAL FIXATION (ORIF) RADIAL FRACTURE;  Surgeon: Corky Mull, MD;  Location: [facility] ORS;  Service: Orthopedics;  Laterality: Left;  . SHOULDER OPEN ROTATOR CUFF  REPAIR  1994   left shoulder repair    Family Psychiatric History: I have reviewed family psychiatric history from my progress note on 01/08/2019.  Family History:  Family History  Problem Relation Age of Onset  . Diabetes Mother   . Hypertension Father   . Alcohol abuse Brother   . Alcohol abuse Maternal Grandfather   . Drug abuse Maternal Grandfather     Social History: I have reviewed social history from my progress note on 01/08/2019. Social History   Socioeconomic History  . Marital status: Married    Spouse name: Not on file  . Number of children: Not on file  . Years of education: Not on file  . Highest education level: Not on file  Occupational History  . Occupation: disabled- worked part-time at [employer] at [place] Use  . Smoking status: Former Smoker    Types: Cigarettes    Quit date: 06/21/2010  Years since quitting: 9.0  . Smokeless tobacco: Never Used  Substance and Sexual Activity  . Alcohol use: No  . Drug use: Not on file  . Sexual activity: Not on file  Other Topics Concern  . Not on file  Social History Narrative  . Not on file   Social Determinants of Health   Financial Resource Strain:   . Difficulty of Paying Living Expenses:   Food Insecurity:   . Worried About Charity fundraiser in the Last Year:   . Arboriculturist in the Last Year:   Transportation Needs:   . Film/video editor (Medical):   Marland Kitchen Lack of Transportation (Non-Medical):   Physical Activity:   . Days of Exercise per Week:   . Minutes of Exercise per Session:   Stress:   . Feeling of Stress :   Social Connections:   . Frequency of Communication with Friends and Family:   . Frequency of Social Gatherings with Friends and Family:   . Attends Religious Services:   . Active Member of Clubs or Organizations:   . Attends Archivist Meetings:   Marland Kitchen Marital Status:     Allergies:  Allergies  Allergen Reactions  . Duloxetine Hcl Other (See Comments)    Other  Reaction: drwsiness;unable to walk  . Sulfonamide Derivatives     Metabolic Disorder Labs: No results found for: HGBA1C, MPG No results found for: PROLACTIN Lab Results  Component Value Date   CHOL 196 06/28/2012   TRIG 93 06/28/2012   HDL 78 06/28/2012   CHOLHDL 2.5 06/28/2012   VLDL 19 06/28/2012   LDLCALC 99 06/28/2012   Lab Results  Component Value Date   TSH 0.95 10/30/2013   TSH 1.841 06/28/2012    Therapeutic Level Labs: No results found for: LITHIUM No results found for: VALPROATE No components found for:  CBMZ  Current Medications: Current Outpatient Medications  Medication Sig Dispense Refill  . L-methylfolate Calcium 15 MG TABS Take by mouth.    . busPIRone (BUSPAR) 10 MG tablet Take 0.5 tablets (5 mg total) by mouth 2 (two) times daily. 30 tablet 1  . Calcium Carbonate Antacid (CALCIUM CARBONATE, DOSED IN MG ELEMENTAL CALCIUM,) 1250 MG/5ML SUSP Take by mouth.    . clonazePAM (KLONOPIN) 0.5 MG tablet Take 0.5 tablets (0.25 mg total) by mouth daily as needed for anxiety. Being weaned off - limit use 15 tablet 0  . [employer] D3 2000 units CAPS Take 2,000 Units by mouth daily.  11  . cyanocobalamin (,VITAMIN B-12,) 1000 MCG/ML injection Inject 1,000 mcg into the muscle every 30 (thirty) days.    . cyclobenzaprine (FLEXERIL) 10 MG tablet cyclobenzaprine 10 mg tablet  TAKE 1 TABLET BY MOUTH EVERYDAY AT BEDTIME    . cyclobenzaprine (FLEXERIL) 10 MG tablet TAKE 1 TABLET BY MOUTH EVERYDAY AT BEDTIME    . cyclobenzaprine (FLEXERIL) 5 MG tablet cyclobenzaprine 5 mg tablet  Take 1 tablet 3 times a day by oral route as needed.    . cyclobenzaprine (FLEXERIL) 5 MG tablet Take 5 mg by mouth 3 (three) times daily as needed.    Marland Kitchen denosumab (PROLIA) 60 MG/ML SOSY injection Prolia 60 mg/mL subcutaneous syringe    . docusate sodium (COLACE) 100 MG capsule Take 100 mg by mouth daily.    . fluticasone (FLONASE) 50 MCG/ACT nasal spray Place 1-2 sprays into both nostrils daily.  0  . FOLIC  ACID PO Take 15 mg by mouth daily.    Marland Kitchen  methadone (DOLOPHINE) 5 MG tablet methadone 5 mg tablet  TAKE 1 1.5 TABLETS (5 7.5 MG TOTAL) BY MOUTH ONCE DAILY    . methadone (DOLOPHINE) 5 MG tablet TAKE 1 1.5 TABLETS (5 7.5 MG TOTAL) BY MOUTH ONCE DAILY    . [START ON 07/30/2019] methadone (DOLOPHINE) 5 MG tablet Take by mouth.    . methylPREDNISolone (MEDROL DOSEPAK) 4 MG TBPK tablet TAKE 1 DOSE PK(S) BY ORAL ROUTE.    . naloxone (NARCAN) nasal spray 4 mg/0.1 mL Place into the nose.    . oxyCODONE (OXY IR/ROXICODONE) 5 MG immediate release tablet Take 1-2 tablets (5-10 mg total) by mouth every 4 (four) hours as needed for moderate pain ((score 4 to 6)). (Patient not taking: Reported on 04/09/2019) 60 tablet 0  . polyethylene glycol powder (GLYCOLAX/MIRALAX) powder MIX 17 GRAMS IN LIQUID AND DRINK DAILY. 527 g 10  . promethazine (PHENERGAN) 25 MG tablet promethazine 25 mg tablet  TAKE 1 TABLET BY MOUTH EVERY 6 HOURS AS NEEDED FOR NAUSEA AND VOMITING    . traMADol (ULTRAM) 50 MG tablet Take 1-2 tablets by mouth every 6 (six) hours as needed for pain.    . Vilazodone HCl (VIIBRYD) 40 MG TABS Take 0.5-1 tablets (20-40 mg total) by mouth daily. 90 tablet 0  . Vitamin D, Ergocalciferol, (DRISDOL) 50000 units CAPS capsule Take 50,000 Units by mouth every 7 (seven) days.  5   No current facility-administered medications for this visit.     Musculoskeletal: Strength & Muscle Tone: UTA Gait & Station: normal Patient leans: N/A  Psychiatric Specialty Exam: Review of Systems  Musculoskeletal: Positive for back pain.  Psychiatric/Behavioral: Positive for agitation and sleep disturbance. The patient is nervous/anxious.   All other systems reviewed and are negative.   There were no vitals taken for this visit.There is no height or weight on file to calculate BMI.  General Appearance: Casual  Eye Contact:  Fair  Speech:  Normal Rate  Volume:  Normal  Mood:  Angry, Anxious and Irritable  Affect:  Labile   Thought Process:  Goal Directed and Descriptions of Associations: Circumstantial  Orientation:  Full (Time, Place, and Person)  Thought Content: Logical   Suicidal Thoughts:  Did not express any  Homicidal Thoughts:  Did not express any  Memory:  Immediate;   Fair Recent;   Fair Remote;   Fair  Judgement:  Other:  shallow  Insight:  Shallow  Psychomotor Activity:  Increased  Concentration:  Concentration: Fair and Attention Span: Fair  Recall:  [employer] of Knowledge: Fair  Language: Fair  Akathisia:  No  Handed:  Right  AIMS (if indicated): UTA  Assets:  Communication Skills Desire for Improvement Housing  ADL's:  Intact  Cognition: WNL  Sleep:  Restless due to pain   Screenings: GAD-7     Office Visit from 01/08/2019 in [facility]  Total GAD-7 Score  6       Assessment and Plan: Katherine [name] is a 69 year old Caucasian female on disability, separated from husband, lives in [place], has a history of anxiety disorder, depression, currently in remission, chronic pain, rheumatoid arthritis, osteoporosis, multiple other medical problems was evaluated by telemedicine today.  Patient today appeared to be agitated in session and was  very resistant to treatment recommendations which were made.  Patient seems to be preoccupied with staying on Klonopin.  Patient declined psychotherapy referral.  Patient initially agreed to starting alternative medication called BuSpar however later on declined that also.  Patient  is noncompliant with all recommendations and seems to be preoccupied with staying on clonazepam in spite of discussing risk/adverse side effects of long-term benzodiazepine therapy and also drug to drug interaction with medications like methadone and benzodiazepines.  For generalized anxiety disorder-currently unstable Continue Viibryd 20 mg p.o. daily. Patient declines any other changes today. Will continue Klonopin 0.25 mg p.o. daily as needed. I  will give her one month supply. I have reviewed [place] controlled substance database.  When writer discussed tapering off Klonopin as summarized above patient declined.  Patient was upset during session and did not want to follow any recommendations. Patient declines psychotherapy. Patient does not believe psychotherapy is beneficial for anxiety.   Since patient is noncompliant with treatment recommendations and also based on patient preference-will advise her to establish care with another provider in the community for further management and treatment.  Patient advised to go to the nearest emergency department if she is in a crisis.  I have spent atleast 20 minutes non face to face with patient today. More than 50 % of the time was spent for preparing to see the patient ( e.g., review of test, records ), ordering medications and test ,psychoeducation and supportive psychotherapy and care coordination,as well as documenting clinical information in electronic health record. This note was generated in part or whole with voice recognition software. Voice recognition is usually quite accurate but there are transcription errors that can and very often do occur. I apologize for any typographical errors that were not detected and corrected.         Ursula Alert, MD 07/09/2019, 5:33 PM

## 2019-12-26 ENCOUNTER — Encounter: Payer: Self-pay | Admitting: Gastroenterology

## 2020-02-04 ENCOUNTER — Encounter: Payer: Medicare HMO | Admitting: Gastroenterology

## 2020-07-26 ENCOUNTER — Other Ambulatory Visit: Payer: Self-pay | Admitting: Psychiatry

## 2020-07-26 DIAGNOSIS — F411 Generalized anxiety disorder: Secondary | ICD-10-CM

## 2020-08-09 ENCOUNTER — Encounter: Payer: Self-pay | Admitting: Physician Assistant

## 2020-08-24 ENCOUNTER — Other Ambulatory Visit: Payer: Self-pay | Admitting: Psychiatry

## 2020-08-24 DIAGNOSIS — F411 Generalized anxiety disorder: Secondary | ICD-10-CM

## 2020-09-01 ENCOUNTER — Other Ambulatory Visit (INDEPENDENT_AMBULATORY_CARE_PROVIDER_SITE_OTHER): Payer: Self-pay

## 2020-09-01 ENCOUNTER — Ambulatory Visit (INDEPENDENT_AMBULATORY_CARE_PROVIDER_SITE_OTHER): Payer: Medicare HMO | Admitting: Physician Assistant

## 2020-09-01 ENCOUNTER — Encounter: Payer: Self-pay | Admitting: Physician Assistant

## 2020-09-01 VITALS — BP 110/50 | HR 71 | Ht 64.0 in | Wt 107.0 lb

## 2020-09-01 DIAGNOSIS — R634 Abnormal weight loss: Secondary | ICD-10-CM

## 2020-09-01 DIAGNOSIS — R110 Nausea: Secondary | ICD-10-CM | POA: Diagnosis not present

## 2020-09-01 DIAGNOSIS — R1010 Upper abdominal pain, unspecified: Secondary | ICD-10-CM

## 2020-09-01 DIAGNOSIS — R198 Other specified symptoms and signs involving the digestive system and abdomen: Secondary | ICD-10-CM

## 2020-09-01 LAB — CBC WITH DIFFERENTIAL/PLATELET
Basophils Absolute: 0.1 10*3/uL (ref 0.0–0.1)
Basophils Relative: 1 % (ref 0.0–3.0)
Eosinophils Absolute: 0.1 10*3/uL (ref 0.0–0.7)
Eosinophils Relative: 1.1 % (ref 0.0–5.0)
HCT: 38.3 % (ref 36.0–46.0)
Hemoglobin: 12.7 g/dL (ref 12.0–15.0)
Lymphocytes Relative: 27.7 % (ref 12.0–46.0)
Lymphs Abs: 1.7 10*3/uL (ref 0.7–4.0)
MCHC: 33.1 g/dL (ref 30.0–36.0)
MCV: 89.8 fl (ref 78.0–100.0)
Monocytes Absolute: 0.3 10*3/uL (ref 0.1–1.0)
Monocytes Relative: 5.5 % (ref 3.0–12.0)
Neutro Abs: 3.9 10*3/uL (ref 1.4–7.7)
Neutrophils Relative %: 64.7 % (ref 43.0–77.0)
Platelets: 202 10*3/uL (ref 150.0–400.0)
RBC: 4.26 Mil/uL (ref 3.87–5.11)
RDW: 15.3 % (ref 11.5–15.5)
WBC: 6 10*3/uL (ref 4.0–10.5)

## 2020-09-01 LAB — COMPREHENSIVE METABOLIC PANEL
ALT: 46 U/L — ABNORMAL HIGH (ref 0–35)
AST: 28 U/L (ref 0–37)
Albumin: 4.5 g/dL (ref 3.5–5.2)
Alkaline Phosphatase: 35 U/L — ABNORMAL LOW (ref 39–117)
BUN: 11 mg/dL (ref 6–23)
CO2: 31 mEq/L (ref 19–32)
Calcium: 9.5 mg/dL (ref 8.4–10.5)
Chloride: 103 mEq/L (ref 96–112)
Creatinine, Ser: 0.71 mg/dL (ref 0.40–1.20)
GFR: 87.2 mL/min (ref >60.00)
Glucose, Bld: 77 mg/dL (ref 70–99)
Potassium: 3.8 mEq/L (ref 3.5–5.1)
Sodium: 140 mEq/L (ref 135–145)
Total Bilirubin: 0.4 mg/dL (ref 0.2–1.2)
Total Protein: 6.8 g/dL (ref 6.0–8.3)

## 2020-09-01 LAB — LIPASE: Lipase: 47 U/L (ref 11.0–59.0)

## 2020-09-01 NOTE — Patient Instructions (Addendum)
If you are age 69 or older, your body mass index should be between 23-30. Your Body mass index is 18.37 kg/m. If this is out of the aforementioned range listed, please consider follow up with your Primary Care Provider. __________________________________________________________  The [facility] providers would like to encourage you to use [facility] to communicate with providers for non-urgent requests or questions.  Due to long hold times on the telephone, sending your provider a message by [facility] may be a faster and more efficient [place] to get a response.  Please allow 48 business hours for a response.  Please remember that this is for non-urgent requests.   Your provider has requested that you go to the basement level for lab work before leaving today. Press "B" on the elevator. The lab is located at the first door on the left as you exit the elevator.  You have been scheduled for a CT scan of the abdomen and pelvis at [facility] ([address]---this is in the same building as [employer]).   You are scheduled on 09/06/2020 at 2:30 am. You should arrive 15 minutes prior to your appointment time for registration. Please follow the written instructions below on the day of your exam:  WARNING: IF YOU ARE ALLERGIC TO IODINE/X-RAY DYE, PLEASE NOTIFY RADIOLOGY IMMEDIATELY AT [phone]! YOU WILL BE GIVEN A 13 HOUR PREMEDICATION PREP.  1) Do not eat after 10:30 am (4 hours prior to your test), drink plenty of water prior 2) You have been given 2 bottles of oral contrast to drink. The solution may taste better if refrigerated, but do NOT add ice or any other liquid to this solution. Shake well before drinking.    Drink 1 bottle of contrast @ 12:30 pm (2 hours prior to your exam)  Drink 1 bottle of contrast @ 1:30 pm (1 hour prior to your exam)  You may take any medications as prescribed with a small amount of water, if necessary. If you take any of the following medications:  METFORMIN, GLUCOPHAGE, GLUCOVANCE, AVANDAMET, RIOMET, FORTAMET, Pope MET, JANUMET, GLUMETZA or METAGLIP, you MAY be asked to HOLD this medication 48 hours AFTER the exam.  The purpose of you drinking the oral contrast is to aid in the visualization of your intestinal tract. The contrast solution may cause some diarrhea. Depending on your individual set of symptoms, you may also receive an intravenous injection of x-ray contrast/dye. Plan on being at [facility] for 30 minutes or longer, depending on the type of exam you are having performed.  This test typically takes 30-45 minutes to complete.  If you have any questions regarding your exam or if you need to reschedule, you may call the CT department at [phone] between the hours of 8:00 am and 5:00 pm, Monday-Friday.  ________________________________________________________________________  Follow up pending at this time.  Thank you for entrusting me with your care and choosing [facility]  Amy Esterwood, PA-C

## 2020-09-01 NOTE — Progress Notes (Signed)
Subjective:    Patient ID: Katherine [name], female    DOB: Feb 24, 1951, 70 y.o.   MRN: [id number]  HPI Katherine [name] is a 70 year old white female,, known remotely to Dr. Olevia Perches.  She was last seen in our office in 2011.  She has history of GERD, anxiety disorder, chronic pain syndrome for which she has been on methadone for many years, history of alcohol abuse, rheumatoid arthritis,. She comes in today with complaints of 3 months of symptoms with abdominal bloating and discomfort.  She says her abdomen is flat most mornings and gets distended and uncomfortable as the day goes on.  She is also started having some right upper quadrant pain after eating this does not occur with every meal but is occurring frequently and last for about 30 minutes.  She describes it as a sharp pain.  She has also developed some nausea postprandially without vomiting, and says she generally just does not feel well after eating. Her weight is down 4 to 5 pounds over the past few months.  She is also been having what she describes as greasy "floaty" stools which have been occurring on a frequent basis, no melena or hematochezia. Patient says that she has history of EtOH abuse, she had stopped for many years and then relapsed for about 7 years and has been sober over the past 1 year. She has not had any recent labs or imaging. She had colonoscopy in 2011 which was a normal exam. Reviewing her record she did undergo EUS per Dr. Ardis Hughs in 2010 for complaints of chronic abdominal pain and elevated LFTs with a chronically dilated CBD on imaging.  At EUS she was felt to have a normal-appearing pancreas, no evidence of chronic pancreatitis there is a slightly dilated main pancreatic duct at 6 mm and a diffusely dilated CBD at 19 mm. She subsequently underwent ERCP with stent placement with small sphincterotomy, and then subsequent stent removal.  She did have improvement in pain.  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  Review of Systems Pertinent positive and negative review of systems were noted in the above HPI section.  All other review of systems was otherwise negative.  Outpatient Encounter Medications as of 09/01/2020  Medication Sig  . [employer] D3 2000 units CAPS Take 2,000 Units by mouth daily.  . cyanocobalamin (,VITAMIN B-12,) 1000 MCG/ML injection Inject 1,000 mcg into the muscle every 30 (thirty) days.  . cyclobenzaprine (FLEXERIL) 5 MG tablet Take 5 mg by mouth 3 (three) times daily as needed.  Marland Kitchen denosumab (PROLIA) 60 MG/ML SOSY injection Prolia 60 mg/mL subcutaneous syringe  . FOLIC ACID PO Take 15 mg by mouth daily.  . L-methylfolate Calcium 15 MG TABS Take by mouth.  . methadone (DOLOPHINE) 5 MG tablet methadone 5 mg tablet  TAKE 1 1.5 TABLETS (5 7.5 MG TOTAL) BY MOUTH ONCE DAILY  . methylPREDNISolone (MEDROL DOSEPAK) 4 MG TBPK tablet TAKE 1 DOSE PK(S) BY ORAL ROUTE.  . traMADol (ULTRAM) 50 MG tablet Take 1-2 tablets by mouth every 6 (six) hours as needed for pain.  . Vilazodone HCl (VIIBRYD) 40 MG TABS Take 0.5-1 tablets (20-40 mg total) by mouth daily.  . Vitamin D, Ergocalciferol, (DRISDOL) 50000 units CAPS capsule Take 50,000 Units by mouth every 7 (seven) days.  . [DISCONTINUED] busPIRone (BUSPAR) 10 MG tablet Take 0.5  tablets (5 mg total) by mouth 2 (two) times daily.  . [DISCONTINUED] Calcium Carbonate Antacid (CALCIUM CARBONATE, DOSED IN MG ELEMENTAL CALCIUM,) 1250 MG/5ML SUSP Take by mouth.  . [DISCONTINUED] clonazePAM (KLONOPIN) 0.5 MG tablet Take 0.5 tablets (0.25 mg total) by mouth daily as needed for anxiety. Being weaned off - limit use  . [DISCONTINUED] cyclobenzaprine (FLEXERIL) 10 MG tablet cyclobenzaprine 10 mg tablet  TAKE 1 TABLET BY MOUTH EVERYDAY AT BEDTIME  . [DISCONTINUED] cyclobenzaprine (FLEXERIL) 10 MG tablet TAKE  1 TABLET BY MOUTH EVERYDAY AT BEDTIME  . [DISCONTINUED] cyclobenzaprine (FLEXERIL) 5 MG tablet cyclobenzaprine 5 mg tablet  Take 1 tablet 3 times a day by oral route as needed.  . [DISCONTINUED] docusate sodium (COLACE) 100 MG capsule Take 100 mg by mouth daily.  . [DISCONTINUED] fluticasone (FLONASE) 50 MCG/ACT nasal spray Place 1-2 sprays into both nostrils daily.  . [DISCONTINUED] methadone (DOLOPHINE) 5 MG tablet TAKE 1 1.5 TABLETS (5 7.5 MG TOTAL) BY MOUTH ONCE DAILY  . [DISCONTINUED] methadone (DOLOPHINE) 5 MG tablet Take by mouth.  . [DISCONTINUED] naloxone (NARCAN) nasal spray 4 mg/0.1 mL Place into the nose.  . [DISCONTINUED] oxyCODONE (OXY IR/ROXICODONE) 5 MG immediate release tablet Take 1-2 tablets (5-10 mg total) by mouth every 4 (four) hours as needed for moderate pain ((score 4 to 6)). (Patient not taking: Reported on 04/09/2019)  . [DISCONTINUED] polyethylene glycol powder (GLYCOLAX/MIRALAX) powder MIX 17 GRAMS IN LIQUID AND DRINK DAILY.  . [DISCONTINUED] promethazine (PHENERGAN) 25 MG tablet promethazine 25 mg tablet  TAKE 1 TABLET BY MOUTH EVERY 6 HOURS AS NEEDED FOR NAUSEA AND VOMITING   No facility-administered encounter medications on file as of 09/01/2020.   Allergies  Allergen Reactions  . Duloxetine Hcl Other (See Comments)    Other Reaction: drwsiness;unable to walk  . Sulfonamide Derivatives    Patient Active Problem List   Diagnosis Date Noted  . Noncompliance with treatment plan 07/09/2019  . GAD (generalized anxiety disorder) 01/08/2019  . Hx of major depression 01/08/2019  . Alcohol use disorder, moderate, in sustained remission ([place]) 01/08/2019  . Gastroesophageal reflux disease without esophagitis 05/31/2017  . Forearm fracture 01/29/2017  . Encounter for long-term opiate analgesic use 05/02/2016  . Fall with injury 11/30/2015  . Elevated blood pressure (not hypertension) 11/30/2015  . Rheumatoid arthritis involving multiple sites with positive rheumatoid  factor ([place]) 07/30/2015  . Chronic pain syndrome 09/01/2014  . Bursitis of left hip 07/29/2014  . Heart palpitations 10/30/2013  . Routine general medical examination at a health care facility 06/28/2012  . Lumbar radiculopathy 01/12/2012  . Chronic right SI joint pain 07/17/2011  . Chronic, continuous use of opioids 07/17/2011  . Arthritis of hand 02/11/2010  . CONSTIPATION 04/05/2009  . Episodic mood disorder ([place]) 07/21/2008  . SCOLIOSIS 07/21/2008   Social History   Socioeconomic History  . Marital status: Married    Spouse name: Not on file  . Number of children: Not on file  . Years of education: Not on file  . Highest education level: Not on file  Occupational History  . Occupation: disabled- worked part-time at [employer] at [place] Use  . Smoking status: Former Smoker    Types: Cigarettes    Quit date: 06/21/2010    Years since quitting: 10.2  . Smokeless tobacco: Never Used  Substance and Sexual Activity  . Alcohol use: No  . Drug use: Not on file  . Sexual activity: Not on file  Other Topics Concern  . Not  on file  Social History Narrative  . Not on file   Social Determinants of Health   Financial Resource Strain: Not on file  Food Insecurity: Not on file  Transportation Needs: Not on file  Physical Activity: Not on file  Stress: Not on file  Social Connections: Not on file  Intimate Partner Violence: Not on file    Ms. Yost's family history includes Alcohol abuse in her brother and maternal grandfather; Diabetes in her mother; Drug abuse in her maternal grandfather; Hypertension in her father.      Objective:    Vitals:   09/01/20 1035  BP: (!) 110/50  Pulse: 71    Physical Exam Well-developed thin WF  in no acute distress.  Height, Weight,107 BMI 18.3  HEENT; nontraumatic normocephalic, EOMI, PE R LA, sclera anicteric. Oropharynx;not examined Neck; supple, no JVD Cardiovascular; regular rate and rhythm with S1-S2, no murmur rub or  gallop Pulmonary; Clear bilaterally Abdomen; soft, she is mildly tender in the epigastrium and right upper quadrant, no guarding nondistended, no palpable mass or hepatosplenomegaly, bowel sounds are active, no fluid wave Rectal; not done today Skin; benign exam, no jaundice rash or appreciable lesions Extremities; no clubbing cyanosis or edema skin warm and dry Neuro/Psych; alert and oriented x4, grossly nonfocal mood and affect appropriate       Assessment & Plan:   #10 70 year old white female with 3 to 49-monthhistory of postprandial abdominal bloating and discomfort, change in bowel habits with frequent greasy and floating stools, weight loss of 4 to 5 pounds, postprandial nausea and intermittent postprandial right upper quadrant pain.  Patient has prior history of chronically dilated common bile duct and underwent very remote EUS and an ERCP with stent placement and subsequent removal 2010 per Dr. JArdis Hughs  She apparently did have improvement in symptoms with common bile duct stent placement.  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  She did have a small sphincterotomy done at the time of initial ERCP.  Etiology of her current symptoms are concerning for pancreatic insufficiency, possible chronic pancreatitis, cannot rule out malignancy, rule out biliary stricture, cholelithiasis.  #2 history of EtOH abuse, patient had relapsed for about 7 years and is now sober x1 year #3 chronic pain syndrome maintained on chronic methadone for many years #4 history of rheumatoid arthritis 5.  Anxiety disorder 6.  Colon cancer screening-last colonoscopy normal 2011  Plan CBC with differential, c-Met, lipase, fecal elastase. Schedule for CT of the abdomen and pelvis with IV contrast.  Further recommendations pending findings of labs and imaging.  Patient will be established with Dr.  MRush Landmark Deniya Craigo SGenia HaroldPA-C 09/01/2020   Cc: MLenard Simmer MD

## 2020-09-02 ENCOUNTER — Other Ambulatory Visit: Payer: Medicare HMO

## 2020-09-02 DIAGNOSIS — R634 Abnormal weight loss: Secondary | ICD-10-CM

## 2020-09-02 DIAGNOSIS — R1010 Upper abdominal pain, unspecified: Secondary | ICD-10-CM

## 2020-09-02 DIAGNOSIS — R198 Other specified symptoms and signs involving the digestive system and abdomen: Secondary | ICD-10-CM

## 2020-09-02 DIAGNOSIS — R110 Nausea: Secondary | ICD-10-CM

## 2020-09-02 NOTE — Progress Notes (Signed)
Attending Physician's Attestation   I have reviewed the chart.   I agree with the Advanced Practitioner's note, impression, and recommendations with any updates as below.    Devan Babino Mansouraty, MD [facility] Advanced [facility]oscopy Office # [phone]  

## 2020-09-06 ENCOUNTER — Other Ambulatory Visit: Payer: Self-pay

## 2020-09-06 ENCOUNTER — Ambulatory Visit (INDEPENDENT_AMBULATORY_CARE_PROVIDER_SITE_OTHER)
Admission: RE | Admit: 2020-09-06 | Discharge: 2020-09-06 | Disposition: A | Discharge status: Home / Self Care | Payer: Medicare HMO | Source: Ambulatory Visit | Attending: Physician Assistant | Admitting: Physician Assistant

## 2020-09-06 DIAGNOSIS — R110 Nausea: Secondary | ICD-10-CM

## 2020-09-06 DIAGNOSIS — R634 Abnormal weight loss: Secondary | ICD-10-CM

## 2020-09-06 DIAGNOSIS — R1010 Upper abdominal pain, unspecified: Secondary | ICD-10-CM | POA: Diagnosis not present

## 2020-09-06 DIAGNOSIS — R198 Other specified symptoms and signs involving the digestive system and abdomen: Secondary | ICD-10-CM

## 2020-09-06 MED ORDER — IOHEXOL 300 MG/ML  SOLN
80.0000 mL | Freq: Once | INTRAMUSCULAR | Status: AC | PRN
  Administered 2020-09-06: 80 mL via INTRAVENOUS

## 2020-09-07 LAB — PANCREATIC ELASTASE, FECAL: Pancreatic Elastase-1, Stool: >500 mcg/g

## 2020-09-09 ENCOUNTER — Telehealth: Payer: Self-pay | Admitting: Physician Assistant

## 2020-09-09 NOTE — Telephone Encounter (Signed)
Inbound call from pt requesting a call back for her results. Please advise.

## 2020-10-11 ENCOUNTER — Other Ambulatory Visit: Payer: Self-pay

## 2020-10-11 ENCOUNTER — Ambulatory Visit (AMBULATORY_SURGERY_CENTER): Payer: Medicare HMO | Admitting: *Deleted

## 2020-10-11 VITALS — Ht 63.5 in | Wt 102.0 lb

## 2020-10-11 DIAGNOSIS — R112 Nausea with vomiting, unspecified: Secondary | ICD-10-CM

## 2020-10-11 DIAGNOSIS — R197 Diarrhea, unspecified: Secondary | ICD-10-CM

## 2020-10-11 DIAGNOSIS — R634 Abnormal weight loss: Secondary | ICD-10-CM

## 2020-10-11 NOTE — Progress Notes (Signed)
No egg or soy allergy known to patient  No issues with past sedation with any surgeries or procedures Patient denies ever being told they had issues or difficulty with intubation  No FH of Malignant Hyperthermia No diet pills per patient No home 02 use per patient  No blood thinners per patient  Pt denies issues with constipation  No A fib or A flutter  EMMI video to pt or via [place] 19 guidelines implemented in PV today with Pt and RN    Due to the COVID-19 pandemic we are asking patients to follow certain guidelines.  Pt aware of COVID protocols and [facility] guidelines

## 2020-10-26 ENCOUNTER — Ambulatory Visit (AMBULATORY_SURGERY_CENTER): Payer: Medicare HMO | Admitting: Gastroenterology

## 2020-10-26 ENCOUNTER — Encounter: Payer: Self-pay | Admitting: Gastroenterology

## 2020-10-26 ENCOUNTER — Other Ambulatory Visit: Payer: Self-pay

## 2020-10-26 VITALS — BP 124/72 | HR 82 | Temp 99.1°F | Resp 17 | Ht 64.0 in | Wt 102.0 lb

## 2020-10-26 DIAGNOSIS — K642 Third degree hemorrhoids: Secondary | ICD-10-CM

## 2020-10-26 DIAGNOSIS — K2970 Gastritis, unspecified, without bleeding: Secondary | ICD-10-CM | POA: Diagnosis not present

## 2020-10-26 DIAGNOSIS — K3189 Other diseases of stomach and duodenum: Secondary | ICD-10-CM | POA: Diagnosis not present

## 2020-10-26 DIAGNOSIS — K259 Gastric ulcer, unspecified as acute or chronic, without hemorrhage or perforation: Secondary | ICD-10-CM | POA: Diagnosis not present

## 2020-10-26 DIAGNOSIS — K2289 Other specified disease of esophagus: Secondary | ICD-10-CM | POA: Diagnosis not present

## 2020-10-26 DIAGNOSIS — K5730 Diverticulosis of large intestine without perforation or abscess without bleeding: Secondary | ICD-10-CM

## 2020-10-26 DIAGNOSIS — K449 Diaphragmatic hernia without obstruction or gangrene: Secondary | ICD-10-CM

## 2020-10-26 DIAGNOSIS — R197 Diarrhea, unspecified: Secondary | ICD-10-CM | POA: Diagnosis present

## 2020-10-26 DIAGNOSIS — D122 Benign neoplasm of ascending colon: Secondary | ICD-10-CM

## 2020-10-26 MED ORDER — SODIUM CHLORIDE 0.9 % IV SOLN: 500.0000 mL | Freq: Once | INTRAVENOUS | Status: DC

## 2020-10-26 MED ORDER — OMEPRAZOLE 40 MG PO CPDR: 40.0000 mg | DELAYED_RELEASE_CAPSULE | Freq: Two times a day (BID) | ORAL | 3 refills | Status: DC

## 2020-10-26 NOTE — Patient Instructions (Signed)
YOU HAD AN ENDOSCOPIC PROCEDURE TODAY AT THE [facility]:   Refer to the procedure report that was given to you for any specific questions about what was found during the examination.  If the procedure report does not answer your questions, please call your gastroenterologist to clarify.  If you requested that your care partner not be given the details of your procedure findings, then the procedure report has been included in a sealed envelope for you to review at your convenience later.  YOU SHOULD EXPECT: Some feelings of bloating in the abdomen. Passage of more gas than usual.  Walking can help get rid of the air that was put into your GI tract during the procedure and reduce the bloating. If you had a lower endoscopy (such as a colonoscopy or flexible sigmoidoscopy) you may notice spotting of blood in your stool or on the toilet paper. If you underwent a bowel prep for your procedure, you may not have a normal bowel movement for a few days.  Please Note:  You might notice some irritation and congestion in your nose or some drainage.  This is from the oxygen used during your procedure.  There is no need for concern and it should clear up in a day or so.  SYMPTOMS TO REPORT IMMEDIATELY:   Following lower endoscopy (colonoscopy or flexible sigmoidoscopy):  Excessive amounts of blood in the stool  Significant tenderness or worsening of abdominal pains  Swelling of the abdomen that is new, acute  Fever of 100F or higher   Following upper endoscopy (EGD)  Vomiting of blood or coffee ground material  New chest pain or pain under the shoulder blades  Painful or persistently difficult swallowing  New shortness of breath  Fever of 100F or higher  Black, tarry-looking stools  For urgent or emergent issues, a gastroenterologist can be reached at any hour by calling [phone]. Do not use MyChart messaging for urgent concerns.    DIET:  We do recommend a small meal at first, but  then you may proceed to your regular diet.  Drink plenty of fluids but you should avoid alcoholic beverages for 24 hours.  ACTIVITY:  You should plan to take it easy for the rest of today and you should NOT DRIVE or use heavy machinery until tomorrow (because of the sedation medicines used during the test).    FOLLOW UP: Our staff will call the number listed on your records 48-72 hours following your procedure to check on you and address any questions or concerns that you may have regarding the information given to you following your procedure. If we do not reach you, we will leave a message.  We will attempt to reach you two times.  During this call, we will ask if you have developed any symptoms of COVID 19. If you develop any symptoms (ie: fever, flu-like symptoms, shortness of breath, cough etc.) before then, please call [phone].  If you test positive for Covid 19 in the 2 weeks post procedure, please call and report this information to us.    If any biopsies were taken you will be contacted by phone or by letter within the next 1-3 weeks.  Please call us at [phone] if you have not heard about the biopsies in 3 weeks.    SIGNATURES/CONFIDENTIALITY: You and/or your care partner have signed paperwork which will be entered into your electronic medical record.  These signatures attest to the fact that that the information above on   your After Visit Summary has been reviewed and is understood.  Full responsibility of the confidentiality of this discharge information lies with you and/or your care-partner. 

## 2020-10-26 NOTE — Op Note (Signed)
[facility] Patient Name: Katherine [name] Procedure Date: 10/26/2020 2:01 PM MRN: [id number] Endoscopist: Justice Britain , MD Age: 70 Referring MD:  Date of Birth: 06-Aug-1950 Gender: Female Account #: [id number] Procedure:                Colonoscopy Indications:              Screening for colorectal malignant neoplasm,                            Incidental - Change in bowel habits, Incidental -                            Diarrhea, Incidental - Weight loss, Abdominal                            distention, Bloating Medicines:                Monitored Anesthesia Care Procedure:                Pre-Anesthesia Assessment:                           - Prior to the procedure, a History and Physical                            was performed, and patient medications and                            allergies were reviewed. The patient's tolerance of                            previous anesthesia was also reviewed. The risks                            and benefits of the procedure and the sedation                            options and risks were discussed with the patient.                            All questions were answered, and informed consent                            was obtained. Prior Anticoagulants: The patient has                            taken no previous anticoagulant or antiplatelet                            agents. ASA Grade Assessment: II - A patient with                            mild systemic disease. After reviewing the risks  and benefits, the patient was deemed in                            satisfactory condition to undergo the procedure.                           After obtaining informed consent, the colonoscope                            was passed under direct vision. Throughout the                            procedure, the patient's blood pressure, pulse, and                            oxygen saturations were monitored  continuously. The                            Olympus PCF-[id number]) Colonoscope was                            introduced through the anus and advanced to the the                            cecum, identified by appendiceal orifice and                            ileocecal valve. The colonoscopy was performed                            without difficulty. The patient tolerated the                            procedure. The quality of the bowel preparation was                            adequate. The ileocecal valve, appendiceal orifice,                            and rectum were photographed. Scope In: 2:22:52 PM Scope Out: 2:50:15 PM Scope Withdrawal Time: 0 hours 18 minutes 51 seconds  Total Procedure Duration: 0 hours 27 minutes 23 seconds  Findings:                 The perianal and digital rectal examinations were                            normal. Pertinent negatives include no palpable                            rectal lesions.                           A large amount of semi-liquid stool was found in  the entire colon, interfering with visualization.                            Lavage of the area was performed using copious                            amounts, resulting in clearance with adequate                            visualization.                           A 4 mm polyp was found in the ascending colon. The                            polyp was sessile. The polyp was removed with a                            cold snare. Resection and retrieval were complete.                           Multiple small-mouthed diverticula were found in                            the recto-sigmoid colon and sigmoid colon.                           Normal mucosa was found in the entire colon                            otherwise. Biopsies for histology were taken with a                            cold forceps from the entire colon for evaluation                             of microscopic colitis.                           Non-bleeding non-thrombosed internal hemorrhoids                            were found during retroflexion, during perianal                            exam and during digital exam. The hemorrhoids were                            Grade III (internal hemorrhoids that prolapse but                            require manual reduction). Complications:            No immediate complications. Estimated Blood Loss:     Estimated blood loss was minimal. Impression:               -  Stool in the entire examined colon - lavaged                            copiously with adequate visualization.                           - One 4 mm polyp in the ascending colon, removed                            with a cold snare. Resected and retrieved.                           - Diverticulosis in the recto-sigmoid colon and in                            the sigmoid colon.                           - Normal mucosa in the entire examined colon                            otherwise. Biopsied.                           - Non-bleeding non-thrombosed internal hemorrhoids. Recommendation:           - The patient will be observed post-procedure,                            until all discharge criteria are met.                           - Discharge patient to home.                           - Patient has a contact number available for                            emergencies. The signs and symptoms of potential                            delayed complications were discussed with the                            patient. Return to normal activities tomorrow.                            Written discharge instructions were provided to the                            patient.                           - High fiber diet.                           -  Continue present medications.                           - Use FiberCon 1-2 tablets PO daily.                           - Continue present  medications.                           - If weight loss unintentional continues, recommend                            PCP consider CT-Chest to complete workup.                           - Await pathology results.                           - Repeat colonoscopy in 08/07/08 years for                            surveillance based on pathology results.                           - Consider other medications to optimize bowel                            habits (as long as diarrhea is not recurreing) -                            query Linzess/Amitiza/Trulance in future.                           - The findings and recommendations were discussed                            with the patient.                           - The findings and recommendations were discussed                            with the designated responsible adult. Justice Britain, MD 10/26/2020 3:04:05 PM

## 2020-10-26 NOTE — Op Note (Signed)
[facility] Patient Name: Katherine [name] Procedure Date: 10/26/2020 2:01 PM MRN: [id number] Endoscopist: Justice Britain , MD Age: 70 Referring MD:  Date of Birth: 12/03/1950 Gender: Female Account #: [id number] Procedure:                Upper GI endoscopy Indications:              Abdominal pain, Abdominal bloating, Diarrhea (now                            improved), Weight loss, Abdominal distention Medicines:                Monitored Anesthesia Care Procedure:                Pre-Anesthesia Assessment:                           - Prior to the procedure, a History and Physical                            was performed, and patient medications and                            allergies were reviewed. The patient's tolerance of                            previous anesthesia was also reviewed. The risks                            and benefits of the procedure and the sedation                            options and risks were discussed with the patient.                            All questions were answered, and informed consent                            was obtained. Prior Anticoagulants: The patient has                            taken no previous anticoagulant or antiplatelet                            agents. ASA Grade Assessment: II - A patient with                            mild systemic disease. After reviewing the risks                            and benefits, the patient was deemed in                            satisfactory condition to undergo the procedure.  After obtaining informed consent, the endoscope was                            passed under direct vision. Throughout the                            procedure, the patient's blood pressure, pulse, and                            oxygen saturations were monitored continuously. The                            GIF [id number] was introduced through the                            mouth, and  advanced to the second part of duodenum.                            The upper GI endoscopy was accomplished without                            difficulty. The patient tolerated the procedure. Scope In: Scope Out: Findings:                 A few white nummular lesions were noted in                            different region of the esophagus. Biopsies were                            taken with a cold forceps for histology to rule out                            Candida.                           The Z-line was irregular and was found 35 cm from                            the incisors.                           A 2 cm hiatal hernia was present.                           Three non-bleeding superficial gastric ulcers with                            a clean ulcer base (Forrest Class III) were found                            in the gastric antrum. The largest lesion was 6 mm  in largest dimension.                           Patchy mild inflammation characterized by erythema,                            friability and granularity was found in the entire                            examined stomach. Biopsies were taken with a cold                            forceps for histology and Helicobacter pylori                            testing.                           No gross lesions were noted in the duodenal bulb,                            in the first portion of the duodenum and in the                            second portion of the duodenum. Biopsies were taken                            with a cold forceps for histology. Complications:            No immediate complications. Estimated Blood Loss:     Estimated blood loss was minimal. Impression:               - White nummular lesions in esophageal mucosa.                            Biopsied.                           - Z-line irregular, 35 cm from the incisors.                           - 2 cm hiatal hernia.                            - Non-bleeding gastric ulcers with a clean ulcer                            base (Forrest Class III). Gastritis. Biopsied.                           - No gross lesions in the duodenal bulb, in the                            first portion of the duodenum and in the second  portion of the duodenum. Biopsied. Recommendation:           - Proceed to scheduled colonoscopy.                           - Observe patient's clinical course.                           - Start Omeprazole 40 mg twice daily for next                            40-month and then decrease to once daily thereafter.                           - Repeat upper endoscopy in 4 months to check                            healing.                           - Consider SIBO breath testing in future.                           - The findings and recommendations were discussed                            with the patient.                           - The findings and recommendations were discussed                            with the designated responsible adult. GJustice Britain MD 10/26/2020 2:57:53 PM

## 2020-10-26 NOTE — Progress Notes (Signed)
C.W. vital signs. 

## 2020-10-26 NOTE — Progress Notes (Signed)
Pt's states no medical or surgical changes since previsit or office visit. 

## 2020-10-26 NOTE — Progress Notes (Signed)
PT taken to PACU. Monitors in place. VSS. Report given to RN. 

## 2020-10-28 ENCOUNTER — Telehealth: Payer: Self-pay

## 2020-10-28 NOTE — Telephone Encounter (Signed)
Left message on follow up call. 

## 2020-10-28 NOTE — Telephone Encounter (Signed)
  Follow up Call-  Call back number 10/26/2020  Post procedure Call Back phone  # [phone]  Permission to leave phone message Yes  Some recent data might be hidden     Patient questions:  Do you have a fever, pain , or abdominal swelling? No. Pain Score  0 *  Have you tolerated food without any problems? Yes.    Have you been able to return to your normal activities? Yes.    Do you have any questions about your discharge instructions: Diet   No. Medications  No. Follow up visit  No.  Do you have questions or concerns about your Care? No.  Actions: * If pain score is 4 or above: No action needed, pain <4.  Have you developed a fever since your procedure? no  2.   Have you had an respiratory symptoms (SOB or cough) since your procedure? no  3.   Have you tested positive for COVID 19 since your procedure no  4.   Have you had any family members/close contacts diagnosed with the COVID 19 since your procedure?  no   If yes to any of these questions please route to Joylene John, RN and Joella Prince, RN

## 2020-11-03 ENCOUNTER — Encounter: Payer: Self-pay | Admitting: Gastroenterology

## 2021-02-06 ENCOUNTER — Other Ambulatory Visit: Payer: Self-pay | Admitting: Gastroenterology

## 2021-02-06 DIAGNOSIS — D122 Benign neoplasm of ascending colon: Secondary | ICD-10-CM

## 2021-02-06 DIAGNOSIS — R197 Diarrhea, unspecified: Secondary | ICD-10-CM

## 2021-04-20 ENCOUNTER — Ambulatory Visit: Payer: Medicare HMO | Admitting: Gastroenterology

## 2021-04-20 ENCOUNTER — Encounter: Payer: Self-pay | Admitting: Gastroenterology

## 2021-04-20 ENCOUNTER — Other Ambulatory Visit: Payer: Medicare HMO

## 2021-04-20 VITALS — BP 110/70 | HR 84 | Ht 63.5 in | Wt 105.0 lb

## 2021-04-20 DIAGNOSIS — Z8711 Personal history of peptic ulcer disease: Secondary | ICD-10-CM

## 2021-04-20 DIAGNOSIS — R1010 Upper abdominal pain, unspecified: Secondary | ICD-10-CM | POA: Diagnosis not present

## 2021-04-20 DIAGNOSIS — Z86010 Personal history of colonic polyps: Secondary | ICD-10-CM

## 2021-04-20 DIAGNOSIS — R140 Abdominal distension (gaseous): Secondary | ICD-10-CM | POA: Diagnosis not present

## 2021-04-20 DIAGNOSIS — K259 Gastric ulcer, unspecified as acute or chronic, without hemorrhage or perforation: Secondary | ICD-10-CM

## 2021-04-20 DIAGNOSIS — A480 Gas gangrene: Secondary | ICD-10-CM

## 2021-04-20 NOTE — Progress Notes (Signed)
[facility] VISIT   Primary Care Provider [place], Lourdes Sledge, MD [address] [place] [address] [phone]   Patient Profile: Katherine [name] is a 71 y.o. female with a pmh significant for arthritis, osteoporosis, scoliosis leading to chronic back pain on methadone, anxiety, MDD, hyperlipidemia,?prior SOD s/p ERCP sphincterotomy, GERD, PUD (manifested as gastritis/gastric ulcers), diverticulosis, colon polyps (TAs).  The patient presents to the [facility] for an evaluation and management of problem(s) noted below:  Problem List 1. Bloating   2. History of gastric ulcer   3. Gases   4. Upper abdominal pain   5. History of peptic ulcer disease   6. Hx of adenomatous colonic polyps     History of Present Illness Please see prior notes for full details of HPI.  Interval History The patient presents for follow-up.  Overall she has been doing better since our procedures and her evaluation.  She still experiences abdominal discomfort as well as gas and bloating at times.  However this is improved from where things were before she saw [place] last year.  She is not taking any significant nonsteroidals or BC/Goody powders.  Is also takes her medications she remains relatively well.  She has not had a significant weight loss as she had previously.  She does take simethicone relatively regularly and it can help her symptoms somewhat but she has never taken more than 1 dose per day.  She is interested in following up her ulcer disease with a repeat endoscopy has had been recommended last year.  GI Review of Systems Positive as above Negative for dysphagia, odynophagia, nausea, vomiting, alteration of bowel habits, melena, hematochezia  Review of Systems General: Denies fevers/chills/weight loss unintentionally Cardiovascular: Denies chest pain Pulmonary: Denies shortness of breath Gastroenterological: See HPI Genitourinary: Denies darkened  urine Hematological: Denies easy bruising/bleeding Endocrine: Denies temperature intolerance Dermatological: Denies jaundice Psychological: Mood is stable   Medications Current Outpatient Medications  Medication Sig Dispense Refill   atorvastatin (LIPITOR) 20 MG tablet Take 20 mg by mouth at bedtime.     cyanocobalamin (,VITAMIN B-12,) 1000 MCG/ML injection Inject 1,000 mcg into the muscle every 30 (thirty) days.     denosumab (PROLIA) 60 MG/ML SOSY injection Prolia 60 mg/mL subcutaneous syringe     FOLIC ACID PO Take 15 mg by mouth daily.     methadone (DOLOPHINE) 5 MG tablet methadone 5 mg tablet  TAKE 1 1.5 TABLETS (5 7.5 MG TOTAL) BY MOUTH ONCE DAILY     omeprazole (PRILOSEC) 40 MG capsule TAKE 1 CAPSULE (40 MG TOTAL) BY MOUTH IN THE MORNING AND AT BEDTIME. TWICE DAILY FOR 2 MONTHS AND THEN DECREASE TO ONCE DAILY. 180 capsule 1   traMADol (ULTRAM) 50 MG tablet Take 1-2 tablets by mouth every 6 (six) hours as needed for pain.     Vilazodone HCl (VIIBRYD) 40 MG TABS Take 0.5-1 tablets (20-40 mg total) by mouth daily. 90 tablet 0   Vitamin D, Ergocalciferol, (DRISDOL) 50000 units CAPS capsule Take 50,000 Units by mouth every 7 (seven) days.  5   No current facility-administered medications for this visit.    Allergies Allergies  Allergen Reactions   Duloxetine Hcl Other (See Comments)    Other Reaction: drwsiness;unable to walk   Prednisone Diarrhea and Nausea Only   Sulfonamide Derivatives     Histories Past Medical History:  Diagnosis Date   Alcoholism ([place])    Anxiety    Arthritis    Rheumatoid  Chronic back pain    followed by pain clinic   Common bile duct dilatation    Depression    GERD (gastroesophageal reflux disease)    Osteoporosis    Retention of urine, unspecified    Scoliosis (and kyphoscoliosis), idiopathic    Past Surgical History:  Procedure Laterality Date   ABDOMINAL HYSTERECTOMY     APPENDECTOMY     BACK SURGERY  10/04   Back surgery-  lumbar decompression (01/2003)   BREAST ENHANCEMENT SURGERY  1998   EUS     EUS AND ERCP WITH STENT PLACEMENT 4/10 FOR 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   ORIF RADIAL FRACTURE Left 01/30/2017   Procedure: OPEN REDUCTION INTERNAL FIXATION (ORIF) RADIAL FRACTURE;  Surgeon: Corky Mull, MD;  Location: [facility] ORS;  Service: Orthopedics;  Laterality: Left;   SHOULDER OPEN ROTATOR CUFF REPAIR  1994   left shoulder repair   Social History   Socioeconomic History   Marital status: Divorced    Spouse name: Not on file   Number of children: 0   Years of education: Not on file   Highest education level: Not on file  Occupational History   Occupation: disabled  Tobacco Use   Smoking status: Former    Types: Cigarettes    Quit date: 06/21/2010    Years since quitting: 10.8   Smokeless tobacco: Never  Vaping Use   Vaping Use: Never used  Substance and Sexual Activity   Alcohol use: No   Drug use: Not Currently    Comment: as a teenager   Sexual activity: Not on file  Other Topics Concern   Not on file  Social History Narrative   Not on file   Social Determinants of Health   Financial Resource Strain: Not on file  Food Insecurity: Not on file  Transportation Needs: Not on file  Physical Activity: Not on file  Stress: Not on file  Social Connections: Not on file  Intimate Partner Violence: Not on file   Family History  Problem Relation Age of Onset   Diabetes Mother    Colon polyps Father    Hypertension Father    Alcohol abuse Brother    Alcohol abuse Maternal Grandfather    Drug abuse Maternal Grandfather    Colon cancer Neg Hx    Esophageal cancer Neg Hx    Rectal cancer Neg Hx    Stomach cancer Neg Hx    I have reviewed her medical, social, and family history in detail and updated the electronic medical record as necessary.    PHYSICAL EXAMINATION  BP 110/70    Pulse 84    Ht 5' 3.5" (1.613 m)    Wt 105 lb (47.6 kg)    BMI 18.31 kg/m  Wt Readings from Last 3  Encounters:  04/20/21 105 lb (47.6 kg)  10/26/20 102 lb (46.3 kg)  10/11/20 102 lb (46.3 kg)  GEN: NAD, appears stated age, doesn't appear chronically ill PSYCH: Cooperative, without pressured speech EYE: Conjunctivae pink, sclerae anicteric ENT: MMM CV: Nontachycardic RESP: No audible wheezing GI: NABS, soft, NT/ND, without rebound or guarding, no HSM appreciated MSK/EXT: No lower extremity edema SKIN: No jaundice NEURO:  Alert & Oriented x 3, no focal deficits   REVIEW OF DATA  I reviewed the following data at the time of this encounter:  GI Procedures and Studies  July 2022 EGD - White nummular lesions in esophageal mucosa. Biopsied. - Z-line irregular, 35 cm from the incisors. - 2 cm hiatal  hernia. - Non-bleeding gastric ulcers with a clean ulcer base (Forrest Class III). Gastritis. Biopsied. - No gross lesions in the duodenal bulb, in the first portion of the duodenum and in the second portion of the duodenum. Biopsied.  July 2022 colonoscopy - Stool in the entire examined colon - lavaged copiously with adequate visualization. - One 4 mm polyp in the ascending colon, removed with a cold snare. Resected and retrieved. - Diverticulosis in the recto-sigmoid colon and in the sigmoid colon. - Normal mucosa in the entire examined colon otherwise. Biopsied. - Non-bleeding non-thrombosed internal hemorrhoids.  Pathology Diagnosis 1. Surgical [P], duodenal - UNREMARKABLE DUODENAL MUCOSA - NEGATIVE FOR FEATURES OF CELIAC DISEASE - NEGATIVE FOR ATYPIA OR MALIGNANCY 2. Surgical [P], gastric - GASTRIC MUCOSA WITH MILD CONGESTION AND REACTIVE CHANGES - NO HELICOBACTER ORGANISMS ARE SEEN ON WARTHIN-STARRY STAIN - NEGATIVE FOR ATYPIA OR MALIGNANCY 3. Surgical [P], random esophageal - SQUAMOUS MUCOSA WITH MILD BASAL CELL HYPERPLASIA - NO FUNGAL ORGANISMS ARE SEEN ON PAS STAIN - NEGATIVE FOR INTESTINAL METAPLASIA, ATYPIA OR MALIGNANCY 4. Surgical [P], colon nos, random sites -  UNREMARKABLE COLONIC MUCOSA - NEGATIVE FOR FEATURES OF MICROSCOPIC COLITIS - NEGATIVE FOR ATYPIA OR MALIGNANCY 5. Surgical [P], colon, ascending, polyp (1) - TUBULAR ADENOMA FRAGMENTS - NEGATIVE FOR HIGH GRADE DYSPLASIA OR MALIGNANCY  Laboratory Studies  Reviewed those in epic  Imaging Studies  June 2022 CT abdomen pelvis with contrast IMPRESSION: 1. No findings in the abdomen or pelvis to account for the patient's history of unexplained weight loss. No acute findings are noted in the abdomen or pelvis. 2. Aortic atherosclerosis.   ASSESSMENT  Katherine [name] is a 71 y.o. female with a pmh significant for arthritis, osteoporosis, scoliosis leading to chronic back pain on methadone, anxiety, MDD, hyperlipidemia,?prior SOD s/p ERCP sphincterotomy, GERD, PUD (manifested as gastritis/gastric ulcers), diverticulosis, colon polyps (TAs).  The patient is seen today for evaluation and management of:  1. Bloating   2. History of gastric ulcer   3. Gases   4. Upper abdominal pain   5. History of peptic ulcer disease   6. Hx of adenomatous colonic polyps    The patient is hemodynamically stable at this time.  Clinically she is improved from where she was last summer.  We will continue to work with her in regards to her gas and bloating symptoms.  We will increase her Gas-X use to a more regular twice daily to see how she does but she may increase that further if it helps her.  We will rule out EPI and consider SIBO breath testing in the future.  She is going to ensure that she is not getting constipated and she will continue some fiber in her diet as well as potentially add fiber supplement daily.  She will keep her current PPI dosing for now but we will consider adjustment of that in the near future.  Follow-up endoscopy to ensure healing of her gastric ulcers is reasonable and we will consider that at follow-up in a few weeks.  All patient questions were answered to the best of my ability, and the  patient agrees to the aforementioned plan of action with follow-up as indicated.   PLAN  Low FODMAP diet to be initiated Gas-X extra strength twice daily but may increase up to 3-4 times per day pending how patient's symptoms are Continue apples for fiber Consider FiberCon addition to daily If constipation becomes more of an issue will need to initiate laxative therapy Fecal fat and  fecal elastase testing to rule out EPI Consider SIBO breath testing in future EGD will be strongly considered later this year pending how the patient is doing to follow-up ulcers from last year Continue PPI therapy Colonoscopy in 2028 for surveillance   Orders Placed This Encounter  Procedures   Fecal fat, qualitative   Pancreatic elastase, fecal    New Prescriptions   No medications on file   Modified Medications   No medications on file    Planned Follow Up No follow-ups on file.   Total Time in Face-to-Face and in Coordination of Care for patient including independent/personal interpretation/review of prior testing, medical history, examination, medication adjustment, communicating results with the patient directly, and documentation within the EHR is 25 minutes.   Justice Britain, MD [facility] Office # [phone]

## 2021-04-20 NOTE — Patient Instructions (Addendum)
Trial of Fodmap diet.   Gas-X Extra-strength twice daily for 1 month.   Consider SIBO testing at next visit.   Keep follow up in 6-8 weeks: 06/02/21 at 1:30pm  Your provider has requested that you go to the basement level for lab work before leaving today. Press "B" on the elevator. The lab is located at the first door on the left as you exit the elevator.   If you are age 70 or older, your body mass index should be between 23-30. Your Body mass index is 18.31 kg/m. If this is out of the aforementioned range listed, please consider follow up with your Primary Care Provider.  If you are age 17 or younger, your body mass index should be between 19-25. Your Body mass index is 18.31 kg/m. If this is out of the aformentioned range listed, please consider follow up with your Primary Care Provider.   ________________________________________________________  The [facility] providers would like to encourage you to use [facility] to communicate with providers for non-urgent requests or questions.  Due to long hold times on the telephone, sending your provider a message by [facility] may be a faster and more efficient [place] to get a response.  Please allow 48 business hours for a response.  Please remember that this is for non-urgent requests.  _______________________________________________________  Due to recent changes in healthcare laws, you may see the results of your imaging and laboratory studies on MyChart before your provider has had a chance to review them.  We understand that in some cases there may be results that are confusing or concerning to you. Not all laboratory results come back in the same time frame and the provider may be waiting for multiple results in order to interpret others.  Please give [place] 48 hours in order for your provider to thoroughly review all the results before contacting the office for clarification of your results.   Thank you for choosing me and [facility].  Dr. Rush Landmark

## 2021-04-21 ENCOUNTER — Encounter: Payer: Self-pay | Admitting: Gastroenterology

## 2021-04-23 ENCOUNTER — Encounter: Payer: Self-pay | Admitting: Gastroenterology

## 2021-04-23 DIAGNOSIS — Z86010 Personal history of colonic polyps: Secondary | ICD-10-CM | POA: Insufficient documentation

## 2021-04-23 DIAGNOSIS — Z860101 Personal history of adenomatous and serrated colon polyps: Secondary | ICD-10-CM | POA: Insufficient documentation

## 2021-04-23 DIAGNOSIS — K259 Gastric ulcer, unspecified as acute or chronic, without hemorrhage or perforation: Secondary | ICD-10-CM | POA: Insufficient documentation

## 2021-04-23 DIAGNOSIS — R140 Abdominal distension (gaseous): Secondary | ICD-10-CM | POA: Insufficient documentation

## 2021-04-23 DIAGNOSIS — Z8711 Personal history of peptic ulcer disease: Secondary | ICD-10-CM | POA: Insufficient documentation

## 2021-04-23 DIAGNOSIS — R1010 Upper abdominal pain, unspecified: Secondary | ICD-10-CM | POA: Insufficient documentation

## 2021-05-17 ENCOUNTER — Telehealth: Payer: Self-pay | Admitting: Gastroenterology

## 2021-05-17 NOTE — Telephone Encounter (Signed)
Inbound call from patient states she is bloating and gas. States took gas x for 5 days and she did not experience any bowel movement and gotten really bloated so she stopped taking it. Does not have the diarrhea anymore

## 2021-05-17 NOTE — Telephone Encounter (Signed)
The pt states she continues to have gas and bloating despite gas ex 3-4 times daily.  She says she tried it for 5 days but became constipated.  She also has not began to follow Fodmap diet. She does not think that her diet is the issue.  She has an appt on 3/2 with GM to discuss.  I advised her to look at the diet and try to follow it as best as she can until the appt. I also advised her that she can try to keep a food journal and see if there are any foods in particular that cause her symptoms. The pt has been advised of the information and verbalized understanding.

## 2021-06-02 ENCOUNTER — Other Ambulatory Visit: Payer: Medicare HMO

## 2021-06-02 ENCOUNTER — Ambulatory Visit: Payer: Medicare HMO | Admitting: Gastroenterology

## 2021-06-02 ENCOUNTER — Encounter: Payer: Self-pay | Admitting: Gastroenterology

## 2021-06-02 VITALS — BP 122/56 | HR 70 | Wt 107.0 lb

## 2021-06-02 DIAGNOSIS — Z8711 Personal history of peptic ulcer disease: Secondary | ICD-10-CM

## 2021-06-02 DIAGNOSIS — R141 Gas pain: Secondary | ICD-10-CM

## 2021-06-02 DIAGNOSIS — R140 Abdominal distension (gaseous): Secondary | ICD-10-CM

## 2021-06-02 DIAGNOSIS — K259 Gastric ulcer, unspecified as acute or chronic, without hemorrhage or perforation: Secondary | ICD-10-CM

## 2021-06-02 NOTE — Progress Notes (Signed)
[facility] VISIT   Primary Care Provider Simpson, Lourdes Sledge, MD [address] [place] [address] [phone]   Patient Profile: Katherine [name] is a 71 y.o. female with a pmh significant for arthritis, osteoporosis, scoliosis leading to chronic back pain on methadone, anxiety, MDD, hyperlipidemia,?prior SOD s/p ERCP sphincterotomy, GERD, PUD (manifested as gastritis/gastric ulcers), diverticulosis, colon polyps (TAs).  The patient presents to the [facility] for an evaluation and management of problem(s) noted below:  Problem List 1. Bloating   2. Abdominal distention   3. Gas pain   4. History of gastric ulcer      History of Present Illness Please see prior notes for full details of HPI.  Interval History The patient returns for a follow-up.  The patient trial Gas-X few days and developed significant constipation.  As a result she stopped it.  She eventually had a bowel movement.  She uses apples as her main source of fiber which helps her go on a near daily basis.  She has tried a low FODMAP diet without any effectiveness.  Interestingly, the patient did notice some significant issues after a when these [name] was eaten and she had significant gas pains and bloating.  The patient presents for follow-up.  She is not taking any nonsteroidals or BC/Goody powders.  She is not taking PPI therapy currently.  Her abdominal distention is becoming disheartening.  She continues to have gas/bloat pain.  GI Review of Systems Positive as above Negative for odynophagia, dysphagia, nausea, vomiting, melena, hematochezia  Review of Systems General: Denies fevers/chills/weight loss unintentionally Cardiovascular: Denies chest pain Pulmonary: Denies shortness of breath Gastroenterological: See HPI Genitourinary: Denies darkened urine Hematological: Denies easy bruising/bleeding Dermatological: Denies jaundice Psychological: Mood is  stable   Medications Current Outpatient Medications  Medication Sig Dispense Refill   denosumab (PROLIA) 60 MG/ML SOSY injection Prolia 60 mg/mL subcutaneous syringe     methadone (DOLOPHINE) 5 MG tablet methadone 5 mg tablet  TAKE 1 1.5 TABLETS (5 7.5 MG TOTAL) BY MOUTH ONCE DAILY     traMADol (ULTRAM) 50 MG tablet Take 1-2 tablets by mouth every 6 (six) hours as needed for pain.     Vilazodone HCl (VIIBRYD) 40 MG TABS Take 0.5-1 tablets (20-40 mg total) by mouth daily. (Patient taking differently: Take 20 mg by mouth daily.) 90 tablet 0   Vitamin D, Ergocalciferol, (DRISDOL) 50000 units CAPS capsule Take 50,000 Units by mouth every 7 (seven) days.  5   No current facility-administered medications for this visit.    Allergies Allergies  Allergen Reactions   Duloxetine Hcl Other (See Comments)    Other Reaction: drwsiness;unable to walk   Prednisone Diarrhea and Nausea Only   Sulfonamide Derivatives     Histories Past Medical History:  Diagnosis Date   Alcoholism ([place])    Anxiety    Arthritis    Rheumatoid    Chronic back pain    followed by pain clinic   Common bile duct dilatation    Depression    GERD (gastroesophageal reflux disease)    Osteoporosis    Retention of urine, unspecified    Scoliosis (and kyphoscoliosis), idiopathic    Past Surgical History:  Procedure Laterality Date   ABDOMINAL HYSTERECTOMY     APPENDECTOMY     BACK SURGERY  10/04   Back surgery- lumbar decompression (01/2003)   BREAST ENHANCEMENT SURGERY  1998   EUS     EUS AND ERCP WITH STENT PLACEMENT 4/10 FOR  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   ORIF RADIAL FRACTURE Left 01/30/2017   Procedure: OPEN REDUCTION INTERNAL FIXATION (ORIF) RADIAL FRACTURE;  Surgeon: Corky Mull, MD;  Location: [facility] ORS;  Service: Orthopedics;  Laterality: Left;   SHOULDER OPEN ROTATOR CUFF REPAIR  1994   left shoulder repair   Social History   Socioeconomic History   Marital status: Divorced    Spouse  name: Not on file   Number of children: 0   Years of education: Not on file   Highest education level: Not on file  Occupational History   Occupation: disabled  Tobacco Use   Smoking status: Former    Types: Cigarettes    Quit date: 06/21/2010    Years since quitting: 10.9   Smokeless tobacco: Never  Vaping Use   Vaping Use: Never used  Substance and Sexual Activity   Alcohol use: No   Drug use: Not Currently    Comment: as a teenager   Sexual activity: Not on file  Other Topics Concern   Not on file  Social History Narrative   Not on file   Social Determinants of Health   Financial Resource Strain: Not on file  Food Insecurity: Not on file  Transportation Needs: Not on file  Physical Activity: Not on file  Stress: Not on file  Social Connections: Not on file  Intimate Partner Violence: Not on file   Family History  Problem Relation Age of Onset   Diabetes Mother    Colon polyps Father    Hypertension Father    Alcohol abuse Brother    Alcohol abuse Maternal Grandfather    Drug abuse Maternal Grandfather    Colon cancer Neg Hx    Esophageal cancer Neg Hx    Rectal cancer Neg Hx    Stomach cancer Neg Hx    I have reviewed her medical, social, and family history in detail and updated the electronic medical record as necessary.    PHYSICAL EXAMINATION  BP (!) 122/56    Pulse 70    Wt 107 lb (48.5 kg)    BMI 18.66 kg/m  Wt Readings from Last 3 Encounters:  06/02/21 107 lb (48.5 kg)  04/20/21 105 lb (47.6 kg)  10/26/20 102 lb (46.3 kg)  GEN: NAD, appears stated age, doesn't appear chronically ill PSYCH: Cooperative, without pressured speech EYE: Conjunctivae pink, sclerae anicteric ENT: MMM CV: Nontachycardic RESP: No audible wheezing GI: NABS, soft, NT, lower abdomen distention noted, without rebound MSK/EXT: No lower extremity edema SKIN: No jaundice NEURO:  Alert & Oriented x 3, no focal deficits   REVIEW OF DATA  I reviewed the following data at the  time of this encounter:  GI Procedures and Studies  Previously reviewed  Laboratory Studies  Reviewed those in epic  Imaging Studies  No new studies to review   ASSESSMENT  Katherine [name] is a 71 y.o. female with a pmh significant for arthritis, osteoporosis, scoliosis leading to chronic back pain on methadone, anxiety, MDD, hyperlipidemia,?prior SOD s/p ERCP sphincterotomy, GERD, PUD (manifested as gastritis/gastric ulcers), diverticulosis, colon polyps (TAs).  The patient is seen today for evaluation and management of:  1. Bloating   2. Abdominal distention   3. Gas pain   4. History of gastric ulcer    The patient is hemodynamically stable.  Clinically, she continues to experience issues from a bloating and gas and distention perspective.  For some reason her PPI therapy has fallen off the she does not know that  made too much of a difference we will need to consider potentially restarting low-dose PPI omeprazole 20 mg in the near future.  I am going to start her on FDgard in an effort of trying to see if this will make any difference for her.  She dropped off her stool studies to evaluate for exocrine pancreas insufficiency.  We are going to consider empiric SIBO antibiotic therapy.  I am concerned however she will not be able to afford Xifaxan so we may end up using Augmentin or Flagyl as an empiric course depending on the findings of her FDgard trial and her EPI evaluation.  If we do that and she continues to have issues then I strongly recommend performing a breath test after 4 weeks of completion from the antibiotic therapy.  Time will tell where things go.  She is interested in endoscopic reevaluation and I think if we do the above-noted work-up and she is still having issues we should pursue follow-up endoscopy this year to follow-up her gastric ulcers that she had previously.  All patient questions were answered to the best of my ability, and the patient agrees to the aforementioned plan of  action with follow-up as indicated.   PLAN  Initiate FDgard for 10 days (samples given to patient) to see if any effectiveness Await fecal elastase and fecal fat studies If negative for EPI and FDgard not helpful then will consider a 10-day course of Augmentin 875 mg twice daily for empiric treatment of possible SIBO Patient has no effectiveness after empiric SIBO treatment then 4 weeks after antibiotic therapy, recommend SIBO breath testing Continue daily fiber Consider restart PPI If above work-up is unremarkable and patient still having issues then strongly recommend repeat EGD later this year   No orders of the defined types were placed in this encounter.   New Prescriptions   No medications on file   Modified Medications   No medications on file    Planned Follow Up No follow-ups on file.   Total Time in Face-to-Face and in Coordination of Care for patient including independent/personal interpretation/review of prior testing, medical history, examination, medication adjustment, communicating results with the patient directly, and documentation within the EHR is 25 minutes.   Justice Britain, MD [facility]oscopy Office # [phone]

## 2021-06-02 NOTE — Patient Instructions (Signed)
Trial of Fdguard for 10 days.  ? ?We will await the results of stool study- Pancreatic elastase.  ? ?Send my chart message in a few weeks letting us know how you are doing.  ? ?If no improvement then Dr Rush Landmark will consider empiric antibiotic therapy.  ? ?If you are age 70 or older, your body mass index should be between 23-30. Your Body mass index is 18.66 kg/m?Marland Kitchen If this is out of the aforementioned range listed, please consider follow up with your Primary Care Provider. ? ?If you are age 50 or younger, your body mass index should be between 19-25. Your Body mass index is 18.66 kg/m?Marland Kitchen If this is out of the aformentioned range listed, please consider follow up with your Primary Care Provider.  ? ?________________________________________________________ ? ?The [facility] providers would like to encourage you to use [facility] to communicate with providers for non-urgent requests or questions.  Due to long hold times on the telephone, sending your provider a message by [facility] may be a faster and more efficient [place] to get a response.  Please allow 48 business hours for a response.  Please remember that this is for non-urgent requests.  ?_______________________________________________________ ? ?Thank you for choosing me and [facility]. ? ?Dr. Rush Landmark ?  ?

## 2021-06-03 ENCOUNTER — Encounter: Payer: Self-pay | Admitting: Gastroenterology

## 2021-06-03 DIAGNOSIS — R140 Abdominal distension (gaseous): Secondary | ICD-10-CM | POA: Insufficient documentation

## 2021-06-03 DIAGNOSIS — R141 Gas pain: Secondary | ICD-10-CM | POA: Insufficient documentation

## 2021-06-03 LAB — FECAL FAT, QUALITATIVE
Fat Qual Neutral, Stl: NORMAL
Fat Qual Total, Stl: NORMAL

## 2021-06-10 LAB — PANCREATIC ELASTASE, FECAL: Pancreatic Elastase-1, Stool: >500 mcg/g

## 2021-06-13 ENCOUNTER — Other Ambulatory Visit: Payer: Self-pay

## 2021-06-13 ENCOUNTER — Telehealth: Payer: Self-pay | Admitting: Gastroenterology

## 2021-06-13 DIAGNOSIS — K6389 Other specified diseases of intestine: Secondary | ICD-10-CM

## 2021-06-13 MED ORDER — AMOXICILLIN-POT CLAVULANATE 875-125 MG PO TABS: 1.0000 | ORAL_TABLET | Freq: Two times a day (BID) | ORAL | 0 refills | Status: DC

## 2021-06-13 NOTE — Telephone Encounter (Signed)
Dr. Rush Landmark reviewed lab results. Given they are normal and according to 06/02/21 POT, Rx for Augmentin has been sent. If no effects after completing ATBs, will complete SIBO testing. My Chart message sent to pt making her aware. ?

## 2021-06-13 NOTE — Telephone Encounter (Signed)
Sent a my chart message over the weekend as follows: ? ?"do I take the anti-biotics now? I assume I need to return visit again." ?

## 2021-06-27 NOTE — Telephone Encounter (Signed)
Dr Rush Landmark the pt states she took the antibiotic and by the 8th day she was completely better.  She finished the course and now the symptoms have returned.  She would like to know what the next steps are.  ?

## 2021-06-27 NOTE — Telephone Encounter (Signed)
Katherine [name], ?She may do 1 more round of antibiotics for a total of 10 more days (Augmentin 875 mg twice daily). ?If she has recurrence of symptoms, then she will need to undergo SIBO breath testing after 4 weeks of being off antibiotic therapy. ?Thanks. ?GM ?

## 2021-06-27 NOTE — Telephone Encounter (Signed)
Inbound call from patient stating that she was told to call back if antibiotic helped her. Patient stated that it has helped and she is wanting another proscription. Please advise.    ?

## 2021-06-28 ENCOUNTER — Other Ambulatory Visit: Payer: Self-pay

## 2021-06-28 DIAGNOSIS — K6389 Other specified diseases of intestine: Secondary | ICD-10-CM

## 2021-06-28 MED ORDER — AMOXICILLIN-POT CLAVULANATE 875-125 MG PO TABS: 1.0000 | ORAL_TABLET | Freq: Two times a day (BID) | ORAL | 0 refills | Status: AC

## 2021-06-28 NOTE — Telephone Encounter (Signed)
The pt has been advised that she can do 1 more round of augmentin and call back if symptoms return.  If she continues to have symptoms after the second round she understands that SIBO would be the next step.   ?

## 2021-07-05 NOTE — Telephone Encounter (Signed)
Inbound call from patient reports she have 2 more days of antibiotic but she does not believe it is working for her. States she is up all night with diarrhea like bowel movements, very loose. Best contact number [phone] ?

## 2021-07-05 NOTE — Telephone Encounter (Signed)
If she has recurrence of symptoms, then she will need to undergo SIBO breath testing after 4 weeks of being off antibiotic therapy. ?Thanks. ?GM ?

## 2021-07-05 NOTE — Telephone Encounter (Signed)
Patty, ?Let's pursue stool studies including a GI pathogen panel and C. difficile due to recent antibiotic exposure. ?Get her back on PPI therapy.  40 mg once daily (omeprazole or Prilosec or Nexium) or Dexilant 30 mg once daily or Aciphex 20 mg once daily.Marland Kitchen ?If symptoms are this significant, then need to pursue repeat endoscopy for follow-up of ulcer which can be offered to her as well. ?Thanks. ?GM ?

## 2021-07-05 NOTE — Telephone Encounter (Signed)
The pt states she has been on 2 rounds of abx and she is now having 10 or more diarrhea stools daily.  She says the smell is "really bad"  She says the bloating resolved but she has almost continuous belching now.  She would like to know what else she can do.  I did tell her that Dr Rush Landmark recommends SIBO if symptoms did not resolve.  She does not want to proceed with SIBO. She say she can not wait 4 weeks to take the test then wait for the results.  She wants to know what to do now.  Do you want to order stool test? ?

## 2021-07-06 ENCOUNTER — Other Ambulatory Visit: Payer: Medicare HMO

## 2021-07-06 ENCOUNTER — Other Ambulatory Visit: Payer: Self-pay

## 2021-07-06 DIAGNOSIS — R197 Diarrhea, unspecified: Secondary | ICD-10-CM

## 2021-07-06 NOTE — Telephone Encounter (Signed)
The pt has been advised that stool studies need to be completed. ?She is taking omeprazole twice daily and will continue.  ?She will pick up the stool test at her earliest convenience  ?She is not interested in EGD at this time she will wait for the stool test results..  ? ?

## 2021-07-09 ENCOUNTER — Other Ambulatory Visit: Payer: Self-pay | Admitting: Gastroenterology

## 2021-07-09 ENCOUNTER — Telehealth: Payer: Self-pay | Admitting: Gastroenterology

## 2021-07-09 ENCOUNTER — Encounter: Payer: Self-pay | Admitting: Gastroenterology

## 2021-07-09 DIAGNOSIS — A498 Other bacterial infections of unspecified site: Secondary | ICD-10-CM

## 2021-07-09 LAB — GI PROFILE, STOOL, PCR

## 2021-07-09 MED ORDER — VANCOMYCIN HCL 125 MG PO CAPS: 125.0000 mg | ORAL_CAPSULE | Freq: Four times a day (QID) | ORAL | 0 refills | Status: AC

## 2021-07-09 NOTE — Progress Notes (Signed)
Opened in error.  Had to change context. ?

## 2021-07-09 NOTE — Telephone Encounter (Signed)
This note was started in order to place p.o. vancomycin orders. ?The patient has not been called but rather a MyChart message was sent this morning. ? ? ?Justice Britain, MD ?[facility] ?Advanced Endoscopy ?Office # [phone] ? ?

## 2021-07-09 NOTE — Progress Notes (Signed)
Opened in error

## 2021-07-09 NOTE — Telephone Encounter (Signed)
Opened in error.  Had to change context to send antibiotics. ?

## 2021-07-11 ENCOUNTER — Other Ambulatory Visit: Payer: Self-pay

## 2021-07-11 ENCOUNTER — Encounter: Payer: Self-pay | Admitting: Gastroenterology

## 2021-07-11 DIAGNOSIS — A498 Other bacterial infections of unspecified site: Secondary | ICD-10-CM

## 2021-07-11 LAB — CLOSTRIDIUM DIFFICILE BY PCR: Toxigenic C. Difficile by PCR: POSITIVE — AB

## 2021-07-11 NOTE — Telephone Encounter (Signed)
Dr Rush Landmark see pt response to condition update.  ? ?Yes, better today but still pooping. maybe 11 times. not as large. Some infection or white running stuff in it.  ?I lost another 5 pds. ?I feel exhausted and I do not know what to expect or eat or not eat or anything.  ?

## 2021-07-11 NOTE — Telephone Encounter (Signed)
Katherine [name], ?I see the updated note from the patient this afternoon. ?Let her come in for electrolyte and blood count check.  Hopefully continued antibiotics will help in time will tell. ?CBC with differential/CMP/magnesium/phosphorus/ionized calcium/ESR/CRP should be obtained so we can see if there is relative dehydration or electrolytes that need to be managed. ?If she feels terrible and cannot tolerate things she will need to come into the hospital. ?She can eat as she feels comfortable a BRAT diet is not unreasonable although data is not clear that that is absolutely necessary.  She needs to focus on calories and staying hydrated. ?Please follow-up tomorrow and see where things stand. ?Thanks. ?GM ?

## 2021-07-12 ENCOUNTER — Other Ambulatory Visit (INDEPENDENT_AMBULATORY_CARE_PROVIDER_SITE_OTHER): Payer: Medicare HMO

## 2021-07-12 DIAGNOSIS — A498 Other bacterial infections of unspecified site: Secondary | ICD-10-CM

## 2021-07-12 LAB — MAGNESIUM: Magnesium: 2.4 mg/dL (ref 1.5–2.5)

## 2021-07-12 LAB — CBC WITH DIFFERENTIAL/PLATELET
Basophils Absolute: 0.1 10*3/uL (ref 0.0–0.1)
Basophils Relative: 0.9 % (ref 0.0–3.0)
Eosinophils Absolute: 0 10*3/uL (ref 0.0–0.7)
Eosinophils Relative: 0.7 % (ref 0.0–5.0)
HCT: 40.6 % (ref 36.0–46.0)
Hemoglobin: 13.1 g/dL (ref 12.0–15.0)
Lymphocytes Relative: 37.1 % (ref 12.0–46.0)
Lymphs Abs: 2.2 10*3/uL (ref 0.7–4.0)
MCHC: 32.3 g/dL (ref 30.0–36.0)
MCV: 88 fl (ref 78.0–100.0)
Monocytes Absolute: 0.5 10*3/uL (ref 0.1–1.0)
Monocytes Relative: 8.4 % (ref 3.0–12.0)
Neutro Abs: 3.1 10*3/uL (ref 1.4–7.7)
Neutrophils Relative %: 52.9 % (ref 43.0–77.0)
Platelets: 280 10*3/uL (ref 150.0–400.0)
RBC: 4.62 Mil/uL (ref 3.87–5.11)
RDW: 14.1 % (ref 11.5–15.5)
WBC: 5.9 10*3/uL (ref 4.0–10.5)

## 2021-07-12 LAB — PHOSPHORUS: Phosphorus: 4.3 mg/dL (ref 2.3–4.6)

## 2021-07-12 LAB — COMPREHENSIVE METABOLIC PANEL
ALT: 50 U/L — ABNORMAL HIGH (ref 0–35)
AST: 30 U/L (ref 0–37)
Albumin: 4.3 g/dL (ref 3.5–5.2)
Alkaline Phosphatase: 90 U/L (ref 39–117)
BUN: 8 mg/dL (ref 6–23)
CO2: 32 mEq/L (ref 19–32)
Calcium: 9.2 mg/dL (ref 8.4–10.5)
Chloride: 102 mEq/L (ref 96–112)
Creatinine, Ser: 0.64 mg/dL (ref 0.40–1.20)
GFR: 90.08 mL/min (ref >60.00)
Glucose, Bld: 72 mg/dL (ref 70–99)
Potassium: 5.5 mEq/L — ABNORMAL HIGH (ref 3.5–5.1)
Sodium: 139 mEq/L (ref 135–145)
Total Bilirubin: 0.2 mg/dL (ref 0.2–1.2)
Total Protein: 6.6 g/dL (ref 6.0–8.3)

## 2021-07-12 LAB — SEDIMENTATION RATE: Sed Rate: 11 mm/hr (ref 0–30)

## 2021-07-12 LAB — HIGH SENSITIVITY CRP: CRP, High Sensitivity: 3.85 mg/L (ref 0.000–5.000)

## 2021-07-12 LAB — CALCIUM: Calcium: 9.2 mg/dL (ref 8.4–10.5)

## 2021-07-13 ENCOUNTER — Other Ambulatory Visit: Payer: Self-pay

## 2021-07-13 DIAGNOSIS — E875 Hyperkalemia: Secondary | ICD-10-CM

## 2021-07-15 ENCOUNTER — Other Ambulatory Visit (INDEPENDENT_AMBULATORY_CARE_PROVIDER_SITE_OTHER): Payer: Medicare HMO

## 2021-07-15 DIAGNOSIS — E875 Hyperkalemia: Secondary | ICD-10-CM | POA: Diagnosis not present

## 2021-07-15 LAB — COMPREHENSIVE METABOLIC PANEL
ALT: 64 U/L — ABNORMAL HIGH (ref 0–35)
AST: 44 U/L — ABNORMAL HIGH (ref 0–37)
Albumin: 4.2 g/dL (ref 3.5–5.2)
Alkaline Phosphatase: 91 U/L (ref 39–117)
BUN: 12 mg/dL (ref 6–23)
CO2: 30 mEq/L (ref 19–32)
Calcium: 9.2 mg/dL (ref 8.4–10.5)
Chloride: 102 mEq/L (ref 96–112)
Creatinine, Ser: 0.67 mg/dL (ref 0.40–1.20)
GFR: 89.09 mL/min (ref >60.00)
Glucose, Bld: 56 mg/dL — ABNORMAL LOW (ref 70–99)
Potassium: 4.5 mEq/L (ref 3.5–5.1)
Sodium: 137 mEq/L (ref 135–145)
Total Bilirubin: 0.4 mg/dL (ref 0.2–1.2)
Total Protein: 6.5 g/dL (ref 6.0–8.3)

## 2021-07-18 ENCOUNTER — Encounter: Payer: Self-pay | Admitting: Gastroenterology

## 2021-07-19 ENCOUNTER — Other Ambulatory Visit: Payer: Self-pay

## 2021-07-19 ENCOUNTER — Telehealth: Payer: Self-pay

## 2021-07-19 DIAGNOSIS — A498 Other bacterial infections of unspecified site: Secondary | ICD-10-CM

## 2021-07-19 MED ORDER — VANCOMYCIN HCL 125 MG PO CAPS: ORAL_CAPSULE | ORAL | 0 refills | Status: DC

## 2021-07-19 MED ORDER — SACCHAROMYCES BOULARDII 250 MG PO CAPS: 250.0000 mg | ORAL_CAPSULE | Freq: Every day | ORAL | 0 refills | Status: DC

## 2021-07-19 NOTE — Telephone Encounter (Signed)
The pt has been advised of the recommendations  per GM.  She will pick up the prescriptions and keep appt for Friday.  She will also come in for labs at her earliest convenience.   ?

## 2021-07-19 NOTE — Telephone Encounter (Signed)
I will call pt and give her the recommendations.  Florastor and Vanc sent to the pharmacy.  Office visit made for 4/21 at 2:30 pm.  Labs have been entered for the pt to have completed.  ?

## 2021-07-19 NOTE — Telephone Encounter (Signed)
Dr Rush Landmark the pt took her last vanc yesterday but is still complaining of watery diarrhea 2-3 times an hour.  Do you want to send in another course of abx?  ?

## 2021-07-19 NOTE — Telephone Encounter (Signed)
Patty, ?I am sorry to hear this. ?If this is truly a failure of PO Vancomycin and she has persistent C. Difficile then we will need to change course. ?Set her up for a clinic visit with one of the APPs or myself this week (if no other time, then with me at 230PM on Friday). ?My plan would be to trial a Vancomycin Taper first but if issues could consider Fidaxomicin (though not clear if we can get approval for that so quickly). ? ?Vancomycin Taper ?125 mg QID x 10-days ?125 mg BID x 7-days ?125 mg QD x 7-days ?125 mg Every 3-days x 4-weeks ? ?Or  ? ?Fidaxomicin ?200 mg BID x 10-days ? ?Also recommend starting Florastor or Align as a Probiotic daily. ? ?She may need updated labs to see if something has changed in regards to her white count and electrolytes.  She can have a CBC with differential/CMP/Mg/Phos/ESR/CRP at time of her clinic visit or done STAT just prior. ? ?If she continues to have symptoms, then will need repeat C. Difficile testing and will also need a possible Flexible sigmoidoscopy. ? ?Thanks. ?GM ?

## 2021-07-19 NOTE — Telephone Encounter (Signed)
Left message on machine to call back I will also send a My Chart message.  ? ? ?You Just now (10:15 AM)  ? ?I will call pt and give her the recommendations.  Florastor and Vanc sent to the pharmacy.  Office visit made for 4/21 at 2:30 pm.  Labs have been entered for the pt to have completed.   ?  ?  ?Note   ? ? Mansouraty, Telford Nab., MD  You 26 minutes ago (9:49 AM)  ? ?Katherine [name], ?I am sorry to hear this. ?If this is truly a failure of PO Vancomycin and she has persistent C. Difficile then we will need to change course. ?Set her up for a clinic visit with one of the APPs or myself this week (if no other time, then with me at 230PM on Friday). ?My plan would be to trial a Vancomycin Taper first but if issues could consider Fidaxomicin (though not clear if we can get approval for that so quickly). ?  ?Vancomycin Taper ?125 mg QID x 10-days ?125 mg BID x 7-days ?125 mg QD x 7-days ?125 mg Every 3-days x 4-weeks ?  ?Or  ? ?Fidaxomicin ?200 mg BID x 10-days ?  ?Also recommend starting Florastor or Align as a Probiotic daily. ?  ?She may need updated labs to see if something has changed in regards to her white count and electrolytes.  She can have a CBC with differential/CMP/Mg/Phos/ESR/CRP at time of her clinic visit or done STAT just prior. ?  ?If she continues to have symptoms, then will need repeat C. Difficile testing and will also need a possible Flexible sigmoidoscopy. ?  ?Thanks. ?GM  ?  ?  ?Note   ? ? You routed conversation to Mansouraty, Telford Nab., MD 1 hour ago (8:55 AM)  ? ?You  Katherine [name] "Katherine [name]" 1 hour ago (8:55 AM)  ? ?I am sorry that you are not feeling well.  I have sent your concerns to Dr Rush Landmark.  I will let you know as soon as he has responded with the next steps in treatment.   ?  ? ?You 1 hour ago (8:54 AM)  ? ?Dr Rush Landmark the pt took her last vanc yesterday but is still complaining of watery diarrhea 2-3 times an hour.  Do you want to send in another course of abx?   ?  ?  ?Note    ? ?Katherine [name] "Katherine [name]"  P [facility] Clinical Pool (supporting Mansouraty, Telford Nab., MD) 1 hour ago (8:44 AM)  ? ?EW ?I took the last one yesterday. I had two days that the constant popp stoped then i have been pooping none stop since Frid at 3:30 am. I know that i have it again. ?  ? ?You  Katherine [name] "Katherine [name]" 1 hour ago (8:21 AM)  ? ?You are currently being treated for C diff.  How many days do you have left of the antibiotics? ?  ? ?Katherine [name] "Katherine [name]"  P Lgi Clinical Pool (supporting Mansouraty, Telford Nab., MD) 17 hours ago (4:54 PM)  ? ?EW ?I do not have an emgercy .. i think i have C deff again!!! ?  ? ?Katherine [name] "Katherine [name]"  P Lgi Clinical Pool (supporting Mansouraty, Telford Nab., MD) 17 hours ago (4:53 PM)  ? ?EW ?What the heck. I believe i still have C deff . Can you read? i do not know if i do or not but i have the symptoms again!!!!!!is that the  best you can do? ?  ? ?You  Katherine [name] "Katherine [name]" 17 hours ago (4:24 PM)  ? ?Overall your electrolytes are stable. The potassium is much improved from the 5.5-4.5.  No additional work-up.This shows that you remain relatively well-hydrated. Your liver enzymes are slightly elevated and that is something that we will need some monitoring moving forward.  10 months ago you had some abnormal liver function test as well as going back to 4 years ago.  Time will tell about what additional work-up will need to be done but while you are being actively treated for C. difficile we will not worry about these at this time.  We have you scheduled to see Tye Savoy on 08/09/21 at 830 am in the office.  ?  ? ?Katherine [name] "Katherine [name]"  P Lgi Clinical Pool (supporting Mansouraty, Telford Nab., MD) 18 hours ago (3:15 PM)  ? ?EW ?Friday night i was up all-night pooping. Sunday night at 3am I started pooping and have done it nonstop and still doing it.  all day.  ?i have 3 more antibiotics left. ?I was hoping it was coming from the antibiotics.  ?I belch like i am throwing  up. ?I eat jello, oatmeal, bananas  ?I have an appt with [facility] at 12 on Tues.  ?I am not sure what i am to do.  ?would be a lot of time for me to drive to [facility] pick up a poop kit drive back. I have to have someone take me.  i do not know what i need to do. Wait or what.  ?  ? ?

## 2021-07-22 ENCOUNTER — Ambulatory Visit: Payer: Medicare HMO | Admitting: Gastroenterology

## 2021-07-22 ENCOUNTER — Encounter: Payer: Self-pay | Admitting: Gastroenterology

## 2021-07-22 ENCOUNTER — Other Ambulatory Visit (INDEPENDENT_AMBULATORY_CARE_PROVIDER_SITE_OTHER): Payer: Medicare HMO

## 2021-07-22 VITALS — BP 104/62 | HR 85 | Ht 63.0 in | Wt 104.1 lb

## 2021-07-22 DIAGNOSIS — A0471 Enterocolitis due to Clostridium difficile, recurrent: Secondary | ICD-10-CM

## 2021-07-22 DIAGNOSIS — R140 Abdominal distension (gaseous): Secondary | ICD-10-CM

## 2021-07-22 DIAGNOSIS — R1010 Upper abdominal pain, unspecified: Secondary | ICD-10-CM

## 2021-07-22 DIAGNOSIS — K529 Noninfective gastroenteritis and colitis, unspecified: Secondary | ICD-10-CM

## 2021-07-22 DIAGNOSIS — A498 Other bacterial infections of unspecified site: Secondary | ICD-10-CM | POA: Diagnosis not present

## 2021-07-22 DIAGNOSIS — R634 Abnormal weight loss: Secondary | ICD-10-CM

## 2021-07-22 DIAGNOSIS — K6389 Other specified diseases of intestine: Secondary | ICD-10-CM

## 2021-07-22 LAB — CBC WITH DIFFERENTIAL/PLATELET
Basophils Absolute: 0.1 10*3/uL (ref 0.0–0.1)
Basophils Relative: 1.1 % (ref 0.0–3.0)
Eosinophils Absolute: 0 10*3/uL (ref 0.0–0.7)
Eosinophils Relative: 0.6 % (ref 0.0–5.0)
HCT: 40.6 % (ref 36.0–46.0)
Hemoglobin: 13.2 g/dL (ref 12.0–15.0)
Lymphocytes Relative: 34.1 % (ref 12.0–46.0)
Lymphs Abs: 1.8 10*3/uL (ref 0.7–4.0)
MCHC: 32.5 g/dL (ref 30.0–36.0)
MCV: 87.5 fl (ref 78.0–100.0)
Monocytes Absolute: 0.4 10*3/uL (ref 0.1–1.0)
Monocytes Relative: 7.4 % (ref 3.0–12.0)
Neutro Abs: 3 10*3/uL (ref 1.4–7.7)
Neutrophils Relative %: 56.8 % (ref 43.0–77.0)
Platelets: 219 10*3/uL (ref 150.0–400.0)
RBC: 4.64 Mil/uL (ref 3.87–5.11)
RDW: 14.2 % (ref 11.5–15.5)
WBC: 5.2 10*3/uL (ref 4.0–10.5)

## 2021-07-22 LAB — COMPREHENSIVE METABOLIC PANEL
ALT: 84 U/L — ABNORMAL HIGH (ref 0–35)
AST: 48 U/L — ABNORMAL HIGH (ref 0–37)
Albumin: 4.5 g/dL (ref 3.5–5.2)
Alkaline Phosphatase: 89 U/L (ref 39–117)
BUN: 12 mg/dL (ref 6–23)
CO2: 30 mEq/L (ref 19–32)
Calcium: 9.1 mg/dL (ref 8.4–10.5)
Chloride: 103 mEq/L (ref 96–112)
Creatinine, Ser: 0.82 mg/dL (ref 0.40–1.20)
GFR: 72.9 mL/min (ref >60.00)
Glucose, Bld: 74 mg/dL (ref 70–99)
Potassium: 4.9 mEq/L (ref 3.5–5.1)
Sodium: 138 mEq/L (ref 135–145)
Total Bilirubin: 0.3 mg/dL (ref 0.2–1.2)
Total Protein: 6.8 g/dL (ref 6.0–8.3)

## 2021-07-22 LAB — PHOSPHORUS: Phosphorus: 4.3 mg/dL (ref 2.3–4.6)

## 2021-07-22 LAB — HIGH SENSITIVITY CRP: CRP, High Sensitivity: 1.13 mg/L (ref 0.000–5.000)

## 2021-07-22 LAB — SEDIMENTATION RATE: Sed Rate: 8 mm/hr (ref 0–30)

## 2021-07-22 LAB — MAGNESIUM: Magnesium: 2.4 mg/dL (ref 1.5–2.5)

## 2021-07-22 MED ORDER — VANCOMYCIN HCL 125 MG PO CAPS
125.0000 mg | ORAL_CAPSULE | Freq: Four times a day (QID) | ORAL | 0 refills | Status: AC
Start: 2021-07-22 — End: 2021-08-05

## 2021-07-22 NOTE — Patient Instructions (Addendum)
Your provider has requested that you go to the basement level for lab work before leaving today. Press "B" on the elevator. The lab is located at the first door on the left as you exit the elevator. ? ?Please return your stool specimen on either 4/27, 4/28 or 5/2. ? ?You have been scheduled for a follow up with Carl Best, NP on 08/05/21 at 11:00 am. ? ?We have sent the following medications to your pharmacy for you to pick up at your convenience: ?Vancomycin four times daily x 2 weeks. ? ?If you are age 70 or older, your body mass index should be between 23-30. Your Body mass index is 18.44 kg/m?Marland Kitchen If this is out of the aforementioned range listed, please consider follow up with your Primary Care Provider. ? ?If you are age 70 or younger, your body mass index should be between 19-25. Your Body mass index is 18.44 kg/m?Marland Kitchen If this is out of the aformentioned range listed, please consider follow up with your Primary Care Provider.  ? ?________________________________________________________ ? ?The [facility] providers would like to encourage you to use [facility] to communicate with providers for non-urgent requests or questions.  Due to long hold times on the telephone, sending your provider a message by [facility] may be a faster and more efficient [place] to get a response.  Please allow 48 business hours for a response.  Please remember that this is for non-urgent requests.  ?_______________________________________________________ ? ?Due to recent changes in healthcare laws, you may see the results of your imaging and laboratory studies on MyChart before your provider has had a chance to review them.  We understand that in some cases there may be results that are confusing or concerning to you. Not all laboratory results come back in the same time frame and the provider may be waiting for multiple results in order to interpret others.  Please give [place] 48 hours in order for your provider to thoroughly review all the  results before contacting the office for clarification of your results.  ? ?

## 2021-07-22 NOTE — Progress Notes (Signed)
? ?GASTROENTEROLOGY OUTPATIENT CLINIC VISIT  ? ?Primary Care Provider ?Morayati, Lourdes Sledge, MD ?[address] ?[place] [address] ?[phone] ? ? ?Patient Profile: ?Katherine [name] is a 71 y.o. female with a pmh significant for arthritis, osteoporosis, scoliosis leading to chronic back pain on methadone, anxiety, MDD, hyperlipidemia,?prior SOD s/p ERCP sphincterotomy, GERD, PUD (manifested as gastritis/gastric ulcers), diverticulosis, colon polyps (TAs), C. difficile infection.  The patient presents to the [facility] for an evaluation and management of problem(s) noted below: ? ?Problem List ?1. Recurrent Clostridium difficile diarrhea   ?2. Bloating   ?3. Abdominal distention   ?4. Upper abdominal pain   ?5. Small intestinal bacterial overgrowth (SIBO)   ?6. Chronic diarrhea   ?7. Unintentional weight loss   ? ? ?History of Present Illness ?Please see prior notes for full details of HPI. ? ?Interval History ?Since her last clinic visit, we proceeded with an empiric round of antibiotics for SIBO treatment due to the overall cost of Xifaxan as well as the breath test.  After her first round of Augmentin she did have improvement in her symptoms for a few days but then they quickly returned.  The symptoms included bloating and gas and diarrhea.  Second round of antibiotics were administered.  During that second round of antibiotics she began to experience more episodes of diarrhea.  She came in for further work-up and she was found to have C. difficile.  She has completed a 10-day course of p.o. vancomycin.  Over the course of the last few days she has continued to have issues of diarrhea.  During the worst of it, she was having bowel movements of at least 10 to 15/day.  Now she has having less than 10/day.  She came in for electrolyte check and those were normal more recently.  Due to her persistence of symptoms I asked for her to come into clinic but also to begin a p.o. vancomycin taper.   It has been 3 days since the p.o. vancomycin was ordered but she has not taken any further antibiotics and wanted to discuss things in clinic.  The initial round of vancomycin cost her over $100 and from a financial standpoint this has implications.  She denies any fevers or chills.  She is twice daily PPI therapy currently.  She is having upper abdominal discomfort.  She does show me pictures of her lower abdominal distention that progressed over the course of the day. ? ?GI Review of Systems ?Positive as above including urgency ?Negative for dysphagia, nausea, vomiting, melena, hematochezia ? ?Review of Systems ?General: Positive for unintentional weight loss over the course the last few weeks due to diarrhea; denies fevers/chills ?Cardiovascular: Denies chest pain ?Pulmonary: Denies shortness of breath ?Gastroenterological: See HPI ?Genitourinary: Denies darkened urine ?Hematological: Denies easy bruising/bleeding ?Dermatological: Denies jaundice ?Psychological: Mood is stable ? ? ?Medications ?Current Outpatient Medications  ?Medication Sig Dispense Refill  ? atorvastatin (LIPITOR) 20 MG tablet Take 1 tablet by mouth at bedtime.    ? cyclobenzaprine (FLEXERIL) 10 MG tablet Take 1 tablet by mouth daily as needed.    ? denosumab (PROLIA) 60 MG/ML SOSY injection Prolia 60 mg/mL subcutaneous syringe    ? hydrOXYzine (ATARAX) 10 MG tablet Take 1 tablet by mouth 2 (two) times daily as needed.    ? methadone (DOLOPHINE) 5 MG tablet methadone 5 mg tablet ? TAKE 1 1.5 TABLETS (5 7.5 MG TOTAL) BY MOUTH ONCE DAILY    ? naloxone (NARCAN) nasal spray 4 mg/0.1  mL as needed.    ? omeprazole (PRILOSEC) 40 MG capsule Take 1 capsule by mouth 2 (two) times daily.    ? Probiotic Product (PROBIOTIC PO) Take 1 capsule by mouth daily.    ? traMADol (ULTRAM) 50 MG tablet Take 1-2 tablets by mouth every 6 (six) hours as needed for pain.    ? [START ON 08/04/2021] traMADol (ULTRAM) 50 MG tablet Take 1 tablet by mouth every 6 (six) hours as  needed.    ? vancomycin (VANCOCIN) 125 MG capsule Take 1 capsule (125 mg total) by mouth 4 (four) times daily for 14 days. 56 capsule 0  ? Vilazodone HCl (VIIBRYD) 40 MG TABS Take 0.5-1 tablets (20-40 mg total) by mouth daily. (Patient taking differently: Take 20 mg by mouth daily.) 90 tablet 0  ? Vitamin D, Ergocalciferol, (DRISDOL) 50000 units CAPS capsule Take 50,000 Units by mouth every 7 (seven) days.  5  ? ?No current facility-administered medications for this visit.  ? ? ?Allergies ?Allergies  ?Allergen Reactions  ? Duloxetine Hcl Other (See Comments)  ?  Other Reaction: drwsiness;unable to walk  ? Prednisone Diarrhea and Nausea Only  ? Sulfonamide Derivatives   ? ? ?Histories ?Past Medical History:  ?Diagnosis Date  ? Alcoholism ([place])   ? Anxiety   ? Arthritis   ? Rheumatoid   ? Chronic back pain   ? followed by pain clinic  ? Common bile duct dilatation   ? Depression   ? GERD (gastroesophageal reflux disease)   ? Osteoporosis   ? Retention of urine, unspecified   ? Scoliosis (and kyphoscoliosis), idiopathic   ? ?Past Surgical History:  ?Procedure Laterality Date  ? ABDOMINAL HYSTERECTOMY    ? APPENDECTOMY    ? BACK SURGERY  10/04  ? Back surgery- lumbar decompression (01/2003)  ? [place]  ? EUS    ? EUS AND ERCP WITH STENT PLACEMENT 4/10 FOR 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  ? ORIF RADIAL FRACTURE Left 01/30/2017  ? Procedure: OPEN REDUCTION INTERNAL FIXATION (ORIF) RADIAL FRACTURE;  Surgeon: Corky Mull, MD;  Location: [facility] ORS;  Service: Orthopedics;  Laterality: Left;  ? SHOULDER OPEN ROTATOR CUFF REPAIR  1994  ? left shoulder repair  ? ?Social History  ? ?Socioeconomic History  ? Marital status: Divorced  ?  Spouse name: Not on file  ? Number of children: 0  ? Years of education: Not on file  ? Highest education level: Not on file  ?Occupational History  ? Occupation: disabled  ?Tobacco Use  ? Smoking status: Former  ?  Types: Cigarettes  ?  Quit date: 06/21/2010  ?  Years  since quitting: 11.1  ? Smokeless tobacco: Never  ?Vaping Use  ? Vaping Use: Never used  ?Substance and Sexual Activity  ? Alcohol use: No  ? Drug use: Not Currently  ?  Comment: as a teenager  ? Sexual activity: Not on file  ?Other Topics Concern  ? Not on file  ?Social History Narrative  ? Not on file  ? ?Social Determinants of Health  ? ?Financial Resource Strain: Not on file  ?Food Insecurity: Not on file  ?Transportation Needs: Not on file  ?Physical Activity: Not on file  ?Stress: Not on file  ?Social Connections: Not on file  ?Intimate Partner Violence: Not on file  ? ?Family History  ?Problem Relation Age of Onset  ? Diabetes Mother   ? Colon polyps Father   ? Hypertension Father   ?  Alcohol abuse Brother   ? Alcohol abuse Maternal Grandfather   ? Drug abuse Maternal Grandfather   ? Colon cancer Neg Hx   ? Esophageal cancer Neg Hx   ? Rectal cancer Neg Hx   ? Stomach cancer Neg Hx   ? ?I have reviewed her medical, social, and family history in detail and updated the electronic medical record as necessary.  ? ? ?PHYSICAL EXAMINATION  ?BP 104/62 (BP Location: Left Arm, Patient Position: Sitting, Cuff Size: Normal)   Pulse 85   Ht '5\' 3"'$  (1.6 m)   Wt 104 lb 2 oz (47.2 kg)   BMI 18.44 kg/m?  ?Wt Readings from Last 3 Encounters:  ?07/22/21 104 lb 2 oz (47.2 kg)  ?06/02/21 107 lb (48.5 kg)  ?04/20/21 105 lb (47.6 kg)  ?GEN: NAD, appears stated age, doesn't appear chronically ill, accompanied by family member ?PSYCH: Cooperative, without pressured speech ?EYE: Conjunctivae pink, sclerae anicteric ?ENT: MMM ?CV: Nontachycardic ?RESP: No audible wheezing ?GI: NABS, soft, NT, lower abdomen distention noted, without rebound ?MSK/EXT: No lower extremity edema ?SKIN: No jaundice ?NEURO:  Alert & Oriented x 3, no focal deficits ? ? ?REVIEW OF DATA  ?I reviewed the following data at the time of this encounter: ? ?GI Procedures and Studies  ?Previously reviewed ? ?Laboratory Studies  ?Reviewed those in epic ? ?Imaging  Studies  ?No new studies to review ? ? ?ASSESSMENT  ?Ms. Pangle is a 71 y.o. female with a pmh significant for arthritis, osteoporosis, scoliosis leading to chronic back pain on methadone, anxiety, MDD, hyperl

## 2021-07-25 ENCOUNTER — Telehealth: Payer: Self-pay

## 2021-07-25 ENCOUNTER — Encounter: Payer: Self-pay | Admitting: Gastroenterology

## 2021-07-25 DIAGNOSIS — R634 Abnormal weight loss: Secondary | ICD-10-CM | POA: Insufficient documentation

## 2021-07-25 DIAGNOSIS — R197 Diarrhea, unspecified: Secondary | ICD-10-CM | POA: Insufficient documentation

## 2021-07-25 DIAGNOSIS — K638219 Small intestinal bacterial overgrowth, unspecified: Secondary | ICD-10-CM | POA: Insufficient documentation

## 2021-07-25 DIAGNOSIS — A0471 Enterocolitis due to Clostridium difficile, recurrent: Secondary | ICD-10-CM | POA: Insufficient documentation

## 2021-07-25 DIAGNOSIS — K6389 Other specified diseases of intestine: Secondary | ICD-10-CM | POA: Insufficient documentation

## 2021-07-25 NOTE — Telephone Encounter (Signed)
The pt has been advised to decrease to once daily prilosec in case this is causing her diarrhea.   ?

## 2021-07-25 NOTE — Telephone Encounter (Signed)
-----   Message from Irving Copas., MD sent at 07/25/2021  4:44 AM EDT ----- ?Regarding: Follow-up after clinic ?Katherine [name], ?As I finalized my plan over the weekend, please have the patient back down on her PPI therapy to just once daily in case that is causing any diarrheal issues as well.  Thanks. ?GM ? ?

## 2021-07-28 ENCOUNTER — Other Ambulatory Visit: Payer: Medicare HMO

## 2021-07-28 DIAGNOSIS — A0471 Enterocolitis due to Clostridium difficile, recurrent: Secondary | ICD-10-CM

## 2021-07-29 LAB — CLOSTRIDIUM DIFFICILE TOXIN B, QUALITATIVE, REAL-TIME PCR: Toxigenic C. Difficile by PCR: NOT DETECTED

## 2021-08-05 ENCOUNTER — Ambulatory Visit: Payer: Medicare HMO | Admitting: Nurse Practitioner

## 2021-08-05 ENCOUNTER — Encounter: Payer: Self-pay | Admitting: Nurse Practitioner

## 2021-08-05 VITALS — HR 78 | Ht 62.5 in | Wt 104.4 lb

## 2021-08-05 DIAGNOSIS — A498 Other bacterial infections of unspecified site: Secondary | ICD-10-CM

## 2021-08-05 NOTE — Patient Instructions (Addendum)
Continue vancomycin taper as previously prescribed by Dr Rush Landmark. ?____________________________________________________ ? ?Contact our office should loose or "mudlike" stools recur. ? ?____________________________________________________ ?If you are age 70 or older, your body mass index should be between 23-30. Your Body mass index is 18.79 kg/m?Marland Kitchen If this is out of the aforementioned range listed, please consider follow up with your Primary Care Provider. ? ?If you are age 51 or younger, your body mass index should be between 19-25. Your Body mass index is 18.79 kg/m?Marland Kitchen If this is out of the aformentioned range listed, please consider follow up with your Primary Care Provider.  ? ?_____________________________________________________ ? ?The [place] [facility] [facility] providers would like to encourage you to use [facility] to communicate with providers for non-urgent requests or questions.  Due to long hold times on the telephone, sending your provider a message by [facility] may be a faster and more efficient [place] to get a response.  Please allow 48 business hours for a response.  Please remember that this is for non-urgent requests.  ?_____________________________________________________ ?Due to recent changes in healthcare laws, you may see the results of your imaging and laboratory studies on MyChart before your provider has had a chance to review them.  We understand that in some cases there may be results that are confusing or concerning to you. Not all laboratory results come back in the same time frame and the provider may be waiting for multiple results in order to interpret others.  Please give [place] 48 hours in order for your provider to thoroughly review all the results before contacting the office for clarification of your results.  ? ?

## 2021-08-05 NOTE — Progress Notes (Signed)
? ? ? ?08/05/2021 ?[name] ?[id number] ?08/05/1950 ? ? ?Chief Complaint: Follow up C. Diff  ? ?History of Present Illness: Katherine [name] is a 71 year old [name] with a past medical history of  rheumatoid arthritis, osteoporosis, scoliosis leading to chronic back pain on methadone, anxiety, MDD, hyperlipidemia,? prior SOD s/p ERCP sphincterotomy, GERD, PUD (manifested as gastritis/gastric ulcers), diverticulosis, tubular adenomatous colon polyps and recurrent C. difficile diarrhea.  Refer to office visit with Dr. Rush Landmark 07/22/2021 for comprehensive history review.  Katherine [name] developed recurrent C. difficile infection following a course of Augmentin for presumed SIBO.  At the time of her office visit 07/22/2021 Katherine [name] was prescribed Vancomycin 125 mg p.o. 4 times daily x 14 days. A repeat C. difficile PCR test 07/28/2021 was negative.  Katherine [name] was instructed to continue vancomycin '125mg'$  qid x 14 days with a prolonged taper as follows: Vancomycin 125 mg twice daily for 7 days then vancomycin 125 mg daily for 7 days and vancomycin 125 mg every third day x2 weeks with plans to repeat C. difficile 1 week after completing last dose of vancomycin.  No further weight loss since her office visit 07/22/2021, weight remains 104 pounds. ? ?Katherine [name] presents today for C. difficile follow-up as requested by Dr. Rush Landmark.  Katherine [name] describes having diarrhea 3-5 times daily between 4 AM and 10 AM.  Katherine [name] described her diarrhea as "small formed finger like stools", no mud like loose or watery stools.  No abdominal or anorectal pain.  He is urinating normally.  Katherine [name] continues to have abdominal bloat after eating any food.  Katherine [name] has chronic RUQ pain. Katherine [name] lives in [place] and transportation to our [place] office is challenging therefore Katherine [name] wishes to transition her GI management to a gastroenterologist in [place].  ? ? ?Current Outpatient Medications on File Prior to Visit  ?Medication Sig Dispense Refill  ? atorvastatin (LIPITOR) 20 MG tablet  Take 1 tablet by mouth at bedtime.    ? cyclobenzaprine (FLEXERIL) 10 MG tablet Take 1 tablet by mouth daily as needed.    ? denosumab (PROLIA) 60 MG/ML SOSY injection Prolia 60 mg/mL subcutaneous syringe    ? hydrOXYzine (ATARAX) 10 MG tablet Take 1 tablet by mouth 2 (two) times daily as needed.    ? methadone (DOLOPHINE) 5 MG tablet methadone 5 mg tablet ? TAKE 1 1.5 TABLETS (5 7.5 MG TOTAL) BY MOUTH ONCE DAILY    ? naloxone (NARCAN) nasal spray 4 mg/0.1 mL as needed.    ? omeprazole (PRILOSEC) 40 MG capsule Take 1 capsule by mouth 2 (two) times daily.    ? Probiotic Product (PROBIOTIC PO) Take 1 capsule by mouth daily.    ? traMADol (ULTRAM) 50 MG tablet Take 1 tablet by mouth every 6 (six) hours as needed.    ? vancomycin (VANCOCIN) 125 MG capsule Take 1 capsule (125 mg total) by mouth 4 (four) times daily for 14 days. 56 capsule 0  ? Vilazodone HCl (VIIBRYD) 40 MG TABS Take 0.5-1 tablets (20-40 mg total) by mouth daily. (Patient taking differently: Take 20 mg by mouth daily.) 90 tablet 0  ? Vitamin D, Ergocalciferol, (DRISDOL) 50000 units CAPS capsule Take 50,000 Units by mouth every 7 (seven) days.  5  ? ?No current facility-administered medications on file prior to visit.  ? ?Allergies  ?Allergen Reactions  ? Cymbalta [Duloxetine Hcl] Other (See Comments)  ?  Other Reaction: drwsiness;unable to walk  ? Prednisone Diarrhea and Nausea Only  ? Sulfonamide Derivatives   ? ? ?  Current Medications, Allergies, Past Medical History, Past Surgical History, Family History and Social History were reviewed in [employer] record. ? ? ?Review of Systems:   ?Constitutional: + 12 to 13 lbs weight loss past year. ?Respiratory: Negative for shortness of breath.   ?Cardiovascular: Negative for chest pain, palpitations and leg swelling.  ?Gastrointestinal: See HPI.  ?Musculoskeletal: Negative for back pain or muscle aches.  ?Neurological: Negative for dizziness, headaches or paresthesias.  ? ? ?Physical  Exam: ?Pulse 78   Ht 5' 2.5" (1.588 m) Comment: height measured without shoes  Wt 104 lb 6 oz (47.3 kg)   BMI 18.79 kg/m?  ?Wt Readings from Last 3 Encounters:  ?08/05/21 104 lb 6 oz (47.3 kg)  ?07/22/21 104 lb 2 oz (47.2 kg)  ?06/02/21 107 lb (48.5 kg)  ?  ?General: 71 year old [name] in no acute distress. ?Head: Normocephalic and atraumatic. ?Eyes: No scleral icterus. Conjunctiva pink . ?Ears: Normal auditory acuity. ?Mouth: Dentition intact. No ulcers or lesions.  ?Lungs: Clear throughout to auscultation. ?Heart: Regular rate and rhythm, no murmur. ?Abdomen: Soft, nontender and nondistended. No masses or hepatomegaly. Normal bowel sounds x 4 quadrants.  ?Rectal: Deferred ?Musculoskeletal: Symmetrical with no gross deformities. ?Extremities: No edema. ?Neurological: Alert oriented x 4. No focal deficits.  ?Psychological: Alert and cooperative. Normal mood and affect ? ?Assessment and Recommendations: ? ?1) Recurrent C. Diff infection. Patient is passing multiple small formed finger like stools daily. No watery diarrhea or loose stools. No abdominal pain.  ?-Continue Vancomycin taper as previously prescribed by Dr. Rush Landmark ?-I discussed cholestyramine as an adjunct treatment for C. difficile and to bulk up stool, he did not wish to pursue at this time ?-Patient wishes to transition her GI management to a gastroenterologist in [place] which is closer to home, to discuss further with Dr. Rush Landmark ?-Patient to complete repeat C. difficile stool test 1 week after Katherine [name] finishes the last dose of Vancomycin ?-Diet as tolerated ? ?

## 2021-08-07 NOTE — Progress Notes (Signed)
Attending Physician's Attestation   I have reviewed the chart.   I agree with the Advanced Practitioner's note, impression, and recommendations with any updates as below.    Otis Portal Mansouraty, MD [facility] Office # [phone]  

## 2021-08-08 NOTE — Progress Notes (Signed)
Ammie, pls enter a referral for patient to see GI in [place] area which is closer to her home per her request. See Dr. Donneta Romberg msg below. THX ? ? ?Dr.Mansouraty's response as follows: ?Mansouraty, Telford Nab., MD  Noralyn Pick, NP ?[facility],  ?No particular GI preference of the [place] group, she can be seen by any of them lab availability.  ?Okay for referral to be placed, with reasoning to be patient location and desire of staying local rather than being in [place].  ?Thanks.  ?GM  ?

## 2021-08-09 ENCOUNTER — Other Ambulatory Visit: Payer: Self-pay

## 2021-08-09 ENCOUNTER — Ambulatory Visit: Payer: Medicare HMO | Admitting: Nurse Practitioner

## 2021-08-09 DIAGNOSIS — Z86010 Personal history of colonic polyps: Secondary | ICD-10-CM

## 2021-08-09 DIAGNOSIS — R634 Abnormal weight loss: Secondary | ICD-10-CM

## 2021-08-09 DIAGNOSIS — A0471 Enterocolitis due to Clostridium difficile, recurrent: Secondary | ICD-10-CM

## 2021-08-09 DIAGNOSIS — R1010 Upper abdominal pain, unspecified: Secondary | ICD-10-CM

## 2021-08-09 DIAGNOSIS — Z860101 Personal history of adenomatous and serrated colon polyps: Secondary | ICD-10-CM

## 2021-08-09 DIAGNOSIS — R140 Abdominal distension (gaseous): Secondary | ICD-10-CM

## 2021-08-09 DIAGNOSIS — Z8711 Personal history of peptic ulcer disease: Secondary | ICD-10-CM

## 2021-08-09 DIAGNOSIS — K529 Noninfective gastroenteritis and colitis, unspecified: Secondary | ICD-10-CM

## 2021-08-09 NOTE — Progress Notes (Signed)
Amb referral placed per Dr. Donneta Romberg request. Pt will receive a call from their office to schedule appt ?

## 2021-08-18 ENCOUNTER — Telehealth: Payer: Self-pay | Admitting: Gastroenterology

## 2021-08-18 NOTE — Telephone Encounter (Signed)
Dr. Rush Landmark - pt is requesting to continue care with you. Do you approve? Pt has been provided with reminder to complete testing as previously advised (~ 09/03/21).  From last OV with Carl Best, CRNP:  Assessment and Recommendations:   1) Recurrent C. Diff infection. Patient is passing multiple small formed finger like stools daily. No watery diarrhea or loose stools. No abdominal pain.  -Continue Vancomycin taper as previously prescribed by Dr. Rush Landmark -I discussed cholestyramine as an adjunct treatment for C. difficile and to bulk up stool, he did not wish to pursue at this time -Patient wishes to transition her GI management to a gastroenterologist in [place] which is closer to home, to discuss further with Dr. Rush Landmark -Patient to complete repeat C. difficile stool test 1 week after she finishes the last dose of Vancomycin -Diet as tolerated  Based on pt request to continue care with Dr. Rush Landmark and the above recommendations to continue with POT as documented by Dr. Rush Landmark, we will need to await Dr. Donneta Romberg return for him to further review and to address further. Pt will not complete ATBs until 08/27/21 and would not be required to complete repeat testing until 09/03/21.

## 2021-08-18 NOTE — Telephone Encounter (Signed)
Pt last saw Jaclyn Shaggy will send to her nurse

## 2021-08-18 NOTE — Telephone Encounter (Signed)
Inbound call from patient stating she had asked Dr. Rush Landmark to send her to [facility]. Patient stated that she called them and they can not see her until September, so she would like to continue care with Dr. Rush Landmark. Patient stated that he was wanting to do a C-diff test once she finished her  antibiotics ( she will finish on May 27th). Patient is seeking advice if there is anything else she needs to do in the meantime.

## 2021-08-24 NOTE — Telephone Encounter (Signed)
Ammie, I am okay for the patient to maintain her care with [place], she only wanted to see about going to the [place] group because it was closer to home for her.  No problem she will remain under [place]. Continue with our previous plan as outlined.  Thanks. GM

## 2021-08-25 ENCOUNTER — Other Ambulatory Visit: Payer: Self-pay

## 2021-08-25 DIAGNOSIS — A0471 Enterocolitis due to Clostridium difficile, recurrent: Secondary | ICD-10-CM

## 2021-08-25 NOTE — Addendum Note (Signed)
Addended by: Aleatha Borer J on: 08/25/2021 04:23 PM   Modules accepted: Orders

## 2021-09-07 NOTE — Telephone Encounter (Signed)
Spoke with the patient and advised Dr Rush Landmark will continue to provide her GI care. She has completed the antibiotics. She is not taking any bulking agents. She reports normal stools. She declines repeat stool tests stating " I would rather not go through that ordeal of collecting my stool." She is monitoring herself and will reach out to [place] immediately if there are concerns.

## 2021-09-07 NOTE — Telephone Encounter (Signed)
Thank you for this update. Okay. GM

## 2021-10-13 ENCOUNTER — Other Ambulatory Visit: Payer: Self-pay | Admitting: Gastroenterology

## 2021-10-13 DIAGNOSIS — D122 Benign neoplasm of ascending colon: Secondary | ICD-10-CM

## 2021-10-13 DIAGNOSIS — R197 Diarrhea, unspecified: Secondary | ICD-10-CM

## 2021-11-11 ENCOUNTER — Ambulatory Visit: Payer: Medicare HMO | Admitting: Nurse Practitioner

## 2022-01-02 ENCOUNTER — Telehealth: Payer: Self-pay | Admitting: Gastroenterology

## 2022-01-02 ENCOUNTER — Other Ambulatory Visit: Payer: Self-pay

## 2022-01-02 NOTE — Telephone Encounter (Signed)
The pt was last seen my Jaclyn Shaggy will send to her nurse

## 2022-01-02 NOTE — Telephone Encounter (Signed)
Patient  called to request a new referral to be sent  at  [facility]: Robet Leu MD in [place], fax [phone].Please advise

## 2022-01-02 NOTE — Telephone Encounter (Signed)
Confirmed with the patient that she does want to transition her care to [place] with Dr. Madolyn Frieze. Referral faxed.

## 2022-02-01 ENCOUNTER — Other Ambulatory Visit: Payer: Self-pay

## 2022-02-01 DIAGNOSIS — R350 Frequency of micturition: Secondary | ICD-10-CM

## 2022-02-03 ENCOUNTER — Other Ambulatory Visit
Admission: RE | Admit: 2022-02-03 | Discharge: 2022-02-03 | Disposition: A | Discharge status: Home / Self Care | Payer: Medicare HMO | Attending: Urology | Admitting: Urology

## 2022-02-03 ENCOUNTER — Encounter: Payer: Self-pay | Admitting: Urology

## 2022-02-03 ENCOUNTER — Ambulatory Visit: Payer: Medicare HMO | Admitting: Urology

## 2022-02-03 VITALS — BP 148/78 | HR 88 | Ht 63.0 in | Wt 106.0 lb

## 2022-02-03 DIAGNOSIS — R350 Frequency of micturition: Secondary | ICD-10-CM | POA: Diagnosis not present

## 2022-02-03 DIAGNOSIS — R339 Retention of urine, unspecified: Secondary | ICD-10-CM

## 2022-02-03 LAB — URINALYSIS, COMPLETE (UACMP) WITH MICROSCOPIC
Bilirubin Urine: NEGATIVE
Glucose, UA: NEGATIVE mg/dL
Hgb urine dipstick: NEGATIVE
Ketones, ur: NEGATIVE mg/dL
Leukocytes,Ua: NEGATIVE
Nitrite: NEGATIVE
Protein, ur: NEGATIVE mg/dL
RBC / HPF: NONE SEEN RBC/hpf (ref 0–5)
Specific Gravity, Urine: 1.01 (ref 1.005–1.030)
pH: 7 (ref 5.0–8.0)

## 2022-02-03 LAB — BLADDER SCAN AMB NON-IMAGING

## 2022-02-03 NOTE — Progress Notes (Signed)
02/03/2022 4:42 PM   Katherine [name] 1950/05/12 [id number]  Referring provider: Lenard Simmer, MD [address] [place],  [place] [address]  Chief Complaint  Patient presents with   Urinary Frequency    HPI: 71 year old female who presents today for further evaluation of urinary retention.  Notably, she recently saw OB-GYN in 01/2022 for vaginal atrophy and stenosis.  She is been using a vaginal dilator but is painful.  She is also no longer using topical estrogen cream.  At her last appointment, she was complaining of difficulty starting her urinary stream.  She a lot of pressure in the bladder area.  Per Dr. Marisue Brooklyn notes, she had "irritation" above where her uterus should be.  No incontinence.  Pelvic exam by Dr. Glennon Mac indicated normal external genitalia.  No evidence of vaginal prolapse was noted.  Moderate vaginal atrophy was present.  Does have a personal history of chronic pain on methadone.  She mentions today that she has severe lower back pain.  She does have lower extremity weakness and sometimes cannot walk as result of this.  Several years ago, she was referred to Dr. Cari Caraway but never followed through with this for unclear reasons.  She does not remember being referred to him.  Followed by gastroenterology for history of colon polyps and C. difficile.  Most recently, she was seen and evaluated by Dr. Eliberto Ivory for inability to void.  She was told that her bladder scan results were markedly abnormal and taught how to self cath which she was initially doing 3-4 times a day, now has a good sense when her bladder is full and caths once to twice per day especially before nighttime to keep her from getting up to go the bathroom.  She would like some answers about why she is unable to empty her bladder.   Results for orders placed or performed in visit on 02/03/22  BLADDER SCAN AMB NON-IMAGING  Result Value Ref Range   Scan Result 183m      PMH: Past Medical  History:  Diagnosis Date   Alcoholism ([place]    Anxiety    Arthritis    Rheumatoid    Chronic back pain    followed by pain clinic   Common bile duct dilatation    Depression    GERD (gastroesophageal reflux disease)    Osteoporosis    Retention of urine, unspecified    Scoliosis (and kyphoscoliosis), idiopathic     Surgical History: Past Surgical History:  Procedure Laterality Date   ABDOMINAL HYSTERECTOMY     APPENDECTOMY     BACK SURGERY  10/04   Back surgery- lumbar decompression (01/2003)   BREAST ENHANCEMENT SURGERY  1998   EUS     EUS AND ERCP WITH STENT PLACEMENT 4/10 FOR 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   ORIF RADIAL FRACTURE Left 01/30/2017   Procedure: OPEN REDUCTION INTERNAL FIXATION (ORIF) RADIAL FRACTURE;  Surgeon: PCorky Mull MD;  Location: [facility] ORS;  Service: Orthopedics;  Laterality: Left;   SHOULDER OPEN ROTATOR CUFF REPAIR  1994   left shoulder repair    Home Medications:  Allergies as of 02/03/2022       Reactions   Cymbalta [duloxetine Hcl] Other (See Comments)   Other Reaction: drwsiness;unable to walk   Prednisone Diarrhea, Nausea Only   Sulfonamide Derivatives         Medication List        Accurate as of February 03, 2022 11:59 PM. If you have any questions,  ask your nurse or doctor.          atorvastatin 20 MG tablet Commonly known as: LIPITOR Take 1 tablet by mouth at bedtime.   cyclobenzaprine 10 MG tablet Commonly known as: FLEXERIL Take 1 tablet by mouth daily as needed.   hydrOXYzine 10 MG tablet Commonly known as: ATARAX Take 1 tablet by mouth 2 (two) times daily as needed.   methadone 5 MG tablet Commonly known as: DOLOPHINE methadone 5 mg tablet  TAKE 1 1.5 TABLETS (5 7.5 MG TOTAL) BY MOUTH ONCE DAILY   naloxone 4 MG/0.1ML Liqd nasal spray kit Commonly known as: NARCAN as needed.   omeprazole 40 MG capsule Commonly known as: PRILOSEC TAKE 1 CAPSULE BY MOUTH IN THE MORNING AND AT BEDTIME. TWICE DAILY FOR 2  MONTHS AND THEN DECREASE TO ONCE DAILY   PROBIOTIC PO Take 1 capsule by mouth daily.   Prolia 60 MG/ML Sosy injection Generic drug: denosumab Prolia 60 mg/mL subcutaneous syringe   traMADol 50 MG tablet Commonly known as: ULTRAM Take 1 tablet by mouth every 6 (six) hours as needed.   Viibryd 40 MG Tabs Generic drug: Vilazodone HCl Take 0.5-1 tablets (20-40 mg total) by mouth daily. What changed: how much to take   Vitamin D (Ergocalciferol) 1.25 MG (50000 UNIT) Caps capsule Commonly known as: DRISDOL Take 50,000 Units by mouth every 7 (seven) days.        Allergies:  Allergies  Allergen Reactions   Cymbalta [Duloxetine Hcl] Other (See Comments)    Other Reaction: drwsiness;unable to walk   Prednisone Diarrhea and Nausea Only   Sulfonamide Derivatives     Family History: Family History  Problem Relation Age of Onset   Diabetes Mother    Colon polyps Father    Hypertension Father    Alcohol abuse Brother    Alcohol abuse Maternal Grandfather    Drug abuse Maternal Grandfather    Colon cancer Neg Hx    Esophageal cancer Neg Hx    Rectal cancer Neg Hx    Stomach cancer Neg Hx     Social History:  reports that she quit smoking about 11 years ago. Her smoking use included cigarettes. She has never used smokeless tobacco. She reports that she does not currently use drugs. She reports that she does not drink alcohol.   Physical Exam: BP (!) 148/78   Pulse 88   Ht _0  (1.6 m)   Wt 106 lb (48.1 kg)   BMI 18.78 kg/m   Constitutional:  Alert and oriented, No acute distress. HEENT: [place] AT, moist mucus membranes.  Trachea midline, no masses. Cardiovascular: No clubbing, cyanosis, or edema. Respiratory: Normal respiratory effort, no increased work of breathing. GI: Abdomen is soft, nontender, nondistended, no abdominal masses GU: No CVA tenderness Skin: No rashes, bruises or suspicious lesions. Neurologic: Grossly intact, no focal deficits, moving all 4  extremities. Psychiatric: Normal mood and affect.  Laboratory Data: Lab Results  Component Value Date   WBC 5.2 07/22/2021   HGB 13.2 07/22/2021   HCT 40.6 07/22/2021   MCV 87.5 07/22/2021   PLT 219.0 07/22/2021    Lab Results  Component Value Date   CREATININE 0.82 07/22/2021    Pertinent Imaging: Results for orders placed or performed in visit on 02/03/22  BLADDER SCAN AMB NON-IMAGING  Result Value Ref Range   Scan Result 176m      Assessment & Plan:    1. Urinary frequency/incomplete bladder emptying/urinary retention Records from Dr. WSamuel Germany  office were requested  Agree with self cathing if she is not emptying especially helps with her nighttime frequency, suspect she has overflow incontinence related to this  Discussed the option of pursuing urodynamics to further assess her bladder function and capacity, discussed the procedure and ultimately she declined this.  Based on previous pelvic exams, no indication that she has prolapse or any other anatomic issues to explain her retention.  Given her history of chronic back pain and no urinary retention, I have recommended that she does not fact follow-up/follow through with seeing Dr. Cari Caraway for further assessment to rule out any neurologic/spinal issues potentially contributing to her retention.  We did discuss the differential diagnosis at length.  All questions were answered.   Return in about 1 year (around 02/04/2023) for RUS, sign record request for Dr. Yves Dill.  Hollice Espy, MD  [facility] [address], [place],  [address] [phone]  I spent 65 total minutes on the day of the encounter including pre-visit review of the medical record, face-to-face time with the patient, and post visit ordering of labs/imaging/tests.  Extensive review of records and face-to-face counseling with the patient.

## 2022-02-15 ENCOUNTER — Ambulatory Visit: Payer: Self-pay | Admitting: Urology

## 2022-02-21 ENCOUNTER — Ambulatory Visit
Admission: RE | Admit: 2022-02-21 | Discharge: 2022-02-21 | Disposition: A | Discharge status: Home / Self Care | Payer: Self-pay | Source: Ambulatory Visit | Attending: Orthopedic Surgery | Admitting: Orthopedic Surgery

## 2022-02-21 ENCOUNTER — Other Ambulatory Visit: Payer: Self-pay

## 2022-02-21 DIAGNOSIS — Z049 Encounter for examination and observation for unspecified reason: Secondary | ICD-10-CM

## 2022-03-10 ENCOUNTER — Ambulatory Visit: Payer: Self-pay | Admitting: Orthopedic Surgery

## 2022-05-02 ENCOUNTER — Other Ambulatory Visit: Payer: Self-pay | Admitting: Internal Medicine

## 2022-05-02 DIAGNOSIS — G8929 Other chronic pain: Secondary | ICD-10-CM

## 2022-05-02 DIAGNOSIS — K219 Gastro-esophageal reflux disease without esophagitis: Secondary | ICD-10-CM

## 2022-05-02 DIAGNOSIS — R142 Eructation: Secondary | ICD-10-CM

## 2022-05-11 ENCOUNTER — Ambulatory Visit
Admission: RE | Admit: 2022-05-11 | Discharge: 2022-05-11 | Disposition: A | Discharge status: Home / Self Care | Payer: Medicare HMO | Source: Ambulatory Visit | Attending: Internal Medicine | Admitting: Internal Medicine

## 2022-05-11 DIAGNOSIS — R142 Eructation: Secondary | ICD-10-CM | POA: Insufficient documentation

## 2022-05-11 DIAGNOSIS — G8929 Other chronic pain: Secondary | ICD-10-CM | POA: Insufficient documentation

## 2022-05-11 DIAGNOSIS — R1011 Right upper quadrant pain: Secondary | ICD-10-CM | POA: Insufficient documentation

## 2022-05-11 DIAGNOSIS — K219 Gastro-esophageal reflux disease without esophagitis: Secondary | ICD-10-CM | POA: Diagnosis present

## 2022-05-12 ENCOUNTER — Ambulatory Visit
Admission: RE | Admit: 2022-05-12 | Discharge: 2022-05-12 | Disposition: A | Discharge status: Home / Self Care | Payer: Medicare HMO | Source: Ambulatory Visit | Attending: Internal Medicine | Admitting: Internal Medicine

## 2022-05-12 DIAGNOSIS — R142 Eructation: Secondary | ICD-10-CM | POA: Diagnosis not present

## 2022-05-12 DIAGNOSIS — G8929 Other chronic pain: Secondary | ICD-10-CM

## 2022-05-22 ENCOUNTER — Other Ambulatory Visit: Payer: Self-pay | Admitting: Internal Medicine

## 2022-05-22 DIAGNOSIS — R748 Abnormal levels of other serum enzymes: Secondary | ICD-10-CM

## 2022-05-22 DIAGNOSIS — K838 Other specified diseases of biliary tract: Secondary | ICD-10-CM

## 2022-05-27 ENCOUNTER — Ambulatory Visit
Admission: RE | Admit: 2022-05-27 | Discharge: 2022-05-27 | Disposition: A | Discharge status: Home / Self Care | Payer: Medicare HMO | Source: Ambulatory Visit | Attending: Internal Medicine | Admitting: Internal Medicine

## 2022-05-27 DIAGNOSIS — R748 Abnormal levels of other serum enzymes: Secondary | ICD-10-CM | POA: Insufficient documentation

## 2022-05-27 DIAGNOSIS — K838 Other specified diseases of biliary tract: Secondary | ICD-10-CM | POA: Diagnosis present

## 2023-01-04 ENCOUNTER — Other Ambulatory Visit: Payer: Self-pay

## 2023-01-04 DIAGNOSIS — R350 Frequency of micturition: Secondary | ICD-10-CM

## 2023-01-04 DIAGNOSIS — R339 Retention of urine, unspecified: Secondary | ICD-10-CM

## 2023-01-17 ENCOUNTER — Telehealth: Payer: Self-pay | Admitting: Urology

## 2023-01-17 NOTE — Telephone Encounter (Signed)
Patient called the office regarding a catheter supply order.  She is running out and was told that she needs a new prescription.    She provided the number to [facility] at [phone].  Patient states that she uses [facility] catheters.

## 2023-01-18 NOTE — Telephone Encounter (Signed)
Spoke with [employer] and updated patient's urologist in their system and they will fax the prescription over to [place] to sign off. I called patient and updated her of this information.

## 2023-01-23 ENCOUNTER — Ambulatory Visit
Admission: RE | Admit: 2023-01-23 | Discharge: 2023-01-23 | Disposition: A | Discharge status: Home / Self Care | Payer: Medicare HMO | Source: Ambulatory Visit | Attending: Urology | Admitting: Urology

## 2023-01-23 DIAGNOSIS — R338 Other retention of urine: Secondary | ICD-10-CM | POA: Insufficient documentation

## 2023-01-23 DIAGNOSIS — R339 Retention of urine, unspecified: Secondary | ICD-10-CM | POA: Insufficient documentation

## 2023-01-23 DIAGNOSIS — R350 Frequency of micturition: Secondary | ICD-10-CM | POA: Insufficient documentation

## 2023-01-23 NOTE — Telephone Encounter (Signed)
Ronna with [employer] called and lvm requesting a phone call regarding pending rx. Please return her call; [phone]

## 2023-01-23 NOTE — Telephone Encounter (Signed)
Faxing RX for supply today.

## 2023-02-09 ENCOUNTER — Ambulatory Visit: Payer: Medicare HMO | Admitting: Urology

## 2023-02-23 ENCOUNTER — Ambulatory Visit: Payer: Medicare HMO | Admitting: Urology

## 2023-02-23 VITALS — BP 144/73 | HR 86 | Ht 63.5 in | Wt 111.2 lb

## 2023-02-23 DIAGNOSIS — M549 Dorsalgia, unspecified: Secondary | ICD-10-CM

## 2023-02-23 DIAGNOSIS — R350 Frequency of micturition: Secondary | ICD-10-CM

## 2023-02-23 DIAGNOSIS — R339 Retention of urine, unspecified: Secondary | ICD-10-CM | POA: Diagnosis not present

## 2023-02-23 DIAGNOSIS — G894 Chronic pain syndrome: Secondary | ICD-10-CM | POA: Diagnosis not present

## 2023-02-23 NOTE — Progress Notes (Signed)
Marcelle Overlie Plume,acting as a scribe for Vanna Scotland, MD.,have documented all relevant documentation on the behalf of Vanna Scotland, MD,as directed by  Vanna Scotland, MD while in the presence of Vanna Scotland, MD.  02/23/2023 11:08 AM   Marin Comment 07-Nov-1950 [id number]  Referring provider: Alan Mulder, MD [address] [place],  [place] [address]  Chief Complaint  Patient presents with   Follow-up    HPI: 72 year-old female who returns today for routine annual follow up. She was previously a patient of Dr. Evelene Croon. She has chronic urinary retention and self-caths 3-4 times a day. Please see previous notes for details. In the past, she was offered urodynamics, but she declined.   We received notes from Dr. Amada Jupiter office which are personally reviewed today. His assessment is that her retention was secondary to chronic back pain, weakness in her lower extremities, secondary to back surgery and chronic narcotics with probably neurogenic bladder. There has been no documented infections in the past year.   She had an MRI of the abdomen back in 05/2022 for the purpose of GI workup that showed normal kidneys.   Her most recent creatinine was 0.8 in 05/2022.    She follows up today with a renal ultrasound completed on 02/16/2023 that showed no hydronephrosis or upper tract pathology.   Today, she reports that massages help relax the lower back, improving bladder function. She associates bladder dysfunction with chronic back pain and tension. She experiences difficulty finding the urethra, especially when relaxed or stressed, and uses a mirror to assist. She mentions a history of pelvic floor pain and atrophy of the vagina. She previously saw a pelvic floor therapist years ago, which was beneficial. She expresses interest in revisiting pelvic floor therapy.  She does have chronic pain on methadone.    PMH: Past Medical History:  Diagnosis Date   Alcoholism (H[facility])     Anxiety    Arthritis    Rheumatoid    Chronic back pain    followed by pain clinic   Common bile duct dilatation    Depression    GERD (gastroesophageal reflux disease)    Osteoporosis    Retention of urine, unspecified    Scoliosis (and kyphoscoliosis), idiopathic     Surgical History: Past Surgical History:  Procedure Laterality Date   ABDOMINAL HYSTERECTOMY     APPENDECTOMY     BACK SURGERY  10/04   Back surgery- lumbar decompression (01/2003)   BREAST ENHANCEMENT SURGERY  1998   EUS     EUS AND ERCP WITH STENT PLACEMENT 4/10 FOR 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   ORIF RADIAL FRACTURE Left 01/30/2017   Procedure: OPEN REDUCTION INTERNAL FIXATION (ORIF) RADIAL FRACTURE;  Surgeon: Christena Flake, MD;  Location: [facility] ORS;  Service: Orthopedics;  Laterality: Left;   SHOULDER OPEN ROTATOR CUFF REPAIR  1994   left shoulder repair    Home Medications:  Allergies as of 02/23/2023       Reactions   Cymbalta [duloxetine Hcl] Other (See Comments)   Other Reaction: drwsiness;unable to walk   Prednisone Diarrhea, Nausea Only   Sulfonamide Derivatives         Medication List        Accurate as of February 23, 2023 11:08 AM. If you have any questions, ask your nurse or doctor.          STOP taking these medications    hydrOXYzine 10 MG tablet Commonly known as: ATARAX   omeprazole  40 MG capsule Commonly known as: PRILOSEC   Vitamin D (Ergocalciferol) 1.25 MG (50000 UNIT) Caps capsule Commonly known as: DRISDOL       TAKE these medications    atorvastatin 20 MG tablet Commonly known as: LIPITOR Take 1 tablet by mouth at bedtime.   cyclobenzaprine 10 MG tablet Commonly known as: FLEXERIL Take 1 tablet by mouth daily as needed.   methadone 5 MG tablet Commonly known as: DOLOPHINE methadone 5 mg tablet  TAKE 1 1.5 TABLETS (5 7.5 MG TOTAL) BY MOUTH ONCE DAILY   Movantik 25 MG Tabs tablet Generic drug: naloxegol oxalate Take 25 mg by mouth daily.    naloxone 4 MG/0.1ML Liqd nasal spray kit Commonly known as: NARCAN as needed.   PROBIOTIC PO Take 1 capsule by mouth daily.   Prolia 60 MG/ML Sosy injection Generic drug: denosumab Prolia 60 mg/mL subcutaneous syringe   traMADol 50 MG tablet Commonly known as: ULTRAM Take 1 tablet by mouth every 6 (six) hours as needed.   Vilazodone HCl 20 MG Tabs Take 1 tablet by mouth daily. What changed: Another medication with the same name was removed. Continue taking this medication, and follow the directions you see here.        Allergies:  Allergies  Allergen Reactions   Cymbalta [Duloxetine Hcl] Other (See Comments)    Other Reaction: drwsiness;unable to walk   Prednisone Diarrhea and Nausea Only   Sulfonamide Derivatives     Family History: Family History  Problem Relation Age of Onset   Diabetes Mother    Colon polyps Father    Hypertension Father    Alcohol abuse Brother    Alcohol abuse Maternal Grandfather    Drug abuse Maternal Grandfather    Colon cancer Neg Hx    Esophageal cancer Neg Hx    Rectal cancer Neg Hx    Stomach cancer Neg Hx     Social History:  reports that she quit smoking about 12 years ago. Her smoking use included cigarettes. She has never used smokeless tobacco. She reports that she does not currently use drugs. She reports that she does not drink alcohol.   Physical Exam: BP (!) 144/73   Pulse 86   Ht 5' 3.5" (1.613 m)   Wt 111 lb 4 oz (50.5 kg)   BMI 19.40 kg/m   Constitutional:  Alert and oriented, No acute distress. HEENT: [place] AT, moist mucus membranes.  Trachea midline, no masses. Neurologic: Grossly intact, no focal deficits, moving all 4 extremities. Psychiatric: Normal mood and affect.   Pertinent Imaging:     EXAM: RENAL / URINARY TRACT ULTRASOUND COMPLETE  COMPARISON:  02/11/2007.  FINDINGS: The right kidney measured 10.4 cm and the left kidney measured 10.6 cm. The kidneys demonstrate normal echogenicity. No  renal parenchymal lesions are identified. Left-sided extrarenal pelvis. No shadowing stones are seen. No hydronephrosis.  Bladder:  Appears normal for degree of bladder distention. Prevoid volume 390 mL. Postvoid volume 10 mL.  IMPRESSION: Unremarkable examination of the kidneys and bladder.   Electronically Signed By: Layla Maw M.D. On: 02/16/2023 11:47  This was personally reviewed and I agree with the radiologic interpretation.   Assessment & Plan:    1. Urinary retention - Continue self-catheterization as needed. Educate the patient on the importance of catheterizing if unable to void for 8 hours. - Refer to pelvic floor therapy to address potential pelvic floor dysfunction and teach relaxation techniques. - No need for renal ultrasound next year as current  imaging shows no hydronephrosis or upper tract pathology. - Follow-up annually for catheter supply and management.  2. Chronic back pain - Likely contributing to urinary retention due to neurogenic bladder - Continue current pain management regimen - Consider further evaluation if symptoms worsen   Return in about 1 year (around 02/23/2024).  I have reviewed the above documentation for accuracy and completeness, and I agree with the above.   Vanna Scotland, MD    [facility] [address], [address] [place], [place] [address] [phone]  I spent 31 total minutes on the day of the encounter including pre-visit review of the medical record, face-to-face time with the patient, and post visit ordering of labs/imaging/tests.

## 2023-07-03 NOTE — Progress Notes (Signed)
 07/04/2023 10:36 PM   Katherine [name] 08-Aug-1950 [id number]  Referring provider: Armando Gang, FNP [address] [place],  [place] [address]  Urological history: 1. Chronic urinary retention -RUS (01/2023) -left-sided extrarenal pelvis -[facility] x 4 times daily  Chief Complaint  Patient presents with   Follow-up   HPI: Katherine [name] is a 73 y.o. female who presents today for increased urination, needs updated cath order from 4 caths daily to to 7 daily.   Previous records reviewed.   Over the last two months, she has had to increase her self cathing to 7 times daily.  She is having more urgency.  She is not having issues with self cathing.   Patient denies any modifying or aggravating factors.  Patient denies any recent UTI's, gross hematuria, dysuria or suprapubic/flank pain.  Patient denies any fevers, chills, nausea or vomiting.    UA yellow clear, specific gravity 1.015, trace heme, ph 6.0, 1+ leukocytes, 11-30 WBC's, 3-10 RBC's, 0-10 epithelial cells and a few bateria.    PVR 18 mL  PMH: Past Medical History:  Diagnosis Date   Alcoholism ([facility])    Anxiety    Arthritis    Rheumatoid    Chronic back pain    followed by pain clinic   Common bile duct dilatation    Depression    GERD (gastroesophageal reflux disease)    Osteoporosis    Retention of urine, unspecified    Scoliosis (and kyphoscoliosis), idiopathic     Surgical History: Past Surgical History:  Procedure Laterality Date   ABDOMINAL HYSTERECTOMY     APPENDECTOMY     BACK SURGERY  10/04   Back surgery- lumbar decompression (01/2003)   BREAST ENHANCEMENT SURGERY  1998   EUS     EUS AND ERCP WITH STENT PLACEMENT 4/10 FOR 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   ORIF RADIAL FRACTURE Left 01/30/2017   Procedure: OPEN REDUCTION INTERNAL FIXATION (ORIF) RADIAL FRACTURE;  Surgeon: Christena Flake, MD;  Location: [facility] ORS;  Service: Orthopedics;  Laterality: Left;   SHOULDER OPEN ROTATOR CUFF REPAIR   1994   left shoulder repair    Home Medications:  Allergies as of 07/04/2023       Reactions   Cymbalta [duloxetine Hcl] Other (See Comments)   Other Reaction: drwsiness;unable to walk   Prednisone Diarrhea, Nausea Only   Sulfonamide Derivatives         Medication List        Accurate as of July 04, 2023 11:59 PM. If you have any questions, ask your nurse or doctor.          STOP taking these medications    naloxone 4 MG/0.1ML Liqd nasal spray kit Commonly known as: NARCAN       TAKE these medications    atorvastatin 20 MG tablet Commonly known as: LIPITOR Take 1 tablet by mouth at bedtime.   cyclobenzaprine 10 MG tablet Commonly known as: FLEXERIL Take 1 tablet by mouth daily as needed.   estradiol 0.1 MG/GM vaginal cream Commonly known as: ESTRACE Place vaginally.   methadone 5 MG tablet Commonly known as: DOLOPHINE methadone 5 mg tablet  TAKE 1 1.5 TABLETS (5 7.5 MG TOTAL) BY MOUTH ONCE DAILY   Movantik 25 MG Tabs tablet Generic drug: naloxegol oxalate Take 25 mg by mouth daily.   PROBIOTIC PO Take 1 capsule by mouth daily.   Prolia 60 MG/ML Sosy injection Generic drug: denosumab Prolia 60 mg/mL subcutaneous syringe   traMADol  50 MG tablet Commonly known as: ULTRAM Take 1 tablet by mouth every 6 (six) hours as needed.   Vilazodone HCl 20 MG Tabs Take 1 tablet by mouth daily.        Allergies:  Allergies  Allergen Reactions   Cymbalta [Duloxetine Hcl] Other (See Comments)    Other Reaction: drwsiness;unable to walk   Prednisone Diarrhea and Nausea Only   Sulfonamide Derivatives     Family History: Family History  Problem Relation Age of Onset   Diabetes Mother    Colon polyps Father    Hypertension Father    Alcohol abuse Brother    Alcohol abuse Maternal Grandfather    Drug abuse Maternal Grandfather    Colon cancer Neg Hx    Esophageal cancer Neg Hx    Rectal cancer Neg Hx    Stomach cancer Neg Hx     Social History:   reports that she quit smoking about 13 years ago. Her smoking use included cigarettes. She has never used smokeless tobacco. She reports that she does not currently use drugs. She reports that she does not drink alcohol.  ROS: Pertinent ROS in HPI  Physical Exam: BP 125/66   Pulse 83   Constitutional:  Well nourished. Alert and oriented, No acute distress. HEENT: [place] AT, moist mucus membranes.  Trachea midline Cardiovascular: No clubbing, cyanosis, or edema. Respiratory: Normal respiratory effort, no increased work of breathing. Neurologic: Grossly intact, no focal deficits, moving all 4 extremities. Psychiatric: Normal mood and affect.    Laboratory Data: Urinalysis See EPIC and HPI  I have reviewed the labs.   Pertinent Imaging:  07/04/23 16:03  Scan Result 18 ml       Assessment & Plan:    1. Chronic urinary retention -managed by [facility], but with a recent increase in the need to cath secondary to urgency  2. Microscopic hematuria -explained that the cause of the blood in the urine may be due to UTI, stones, catheter trauma and/or cancer -if her urine culture is negative, we will need to pursue a hematuria work up -if her urine culture is positive, we will need to treat with culture appropriate antibiotics and then repeat the UA to ensure micro heme abates -if micro heme does not abate, will need to pursue hematuria work up   3. Urgency -UA w/ micro heme -culture pending -will continue to investigate for an etiology   Return for pending urine culture results .  These notes generated with voice recognition software. I apologize for typographical errors.  Cloretta Ned  [facility] [address]  [address] [place], [place] [address] [phone]

## 2023-07-04 ENCOUNTER — Ambulatory Visit: Admitting: Urology

## 2023-07-04 VITALS — BP 125/66 | HR 83

## 2023-07-04 DIAGNOSIS — R339 Retention of urine, unspecified: Secondary | ICD-10-CM

## 2023-07-04 DIAGNOSIS — R3129 Other microscopic hematuria: Secondary | ICD-10-CM

## 2023-07-04 DIAGNOSIS — R350 Frequency of micturition: Secondary | ICD-10-CM

## 2023-07-04 LAB — URINALYSIS, COMPLETE
Bilirubin, UA: NEGATIVE
Glucose, UA: NEGATIVE
Ketones, UA: NEGATIVE
Nitrite, UA: NEGATIVE
Protein,UA: NEGATIVE
Specific Gravity, UA: 1.015 (ref 1.005–1.030)
Urobilinogen, Ur: 0.2 mg/dL (ref 0.2–1.0)
pH, UA: 6 (ref 5.0–7.5)

## 2023-07-04 LAB — MICROSCOPIC EXAMINATION

## 2023-07-04 LAB — BLADDER SCAN AMB NON-IMAGING: Scan Result: 18

## 2023-07-07 ENCOUNTER — Encounter: Payer: Self-pay | Admitting: Urology

## 2023-07-10 ENCOUNTER — Other Ambulatory Visit: Payer: Self-pay | Admitting: Urology

## 2023-07-10 DIAGNOSIS — R3129 Other microscopic hematuria: Secondary | ICD-10-CM

## 2023-07-10 LAB — CULTURE, URINE COMPREHENSIVE

## 2023-07-11 NOTE — Telephone Encounter (Signed)
 Notified patient as instructed, patient pleased. Discussed follow-up appointments, patient agrees

## 2023-07-24 ENCOUNTER — Ambulatory Visit
Admission: RE | Admit: 2023-07-24 | Discharge: 2023-07-24 | Disposition: A | Discharge status: Home / Self Care | Source: Ambulatory Visit | Attending: Urology | Admitting: Urology

## 2023-07-24 DIAGNOSIS — R3129 Other microscopic hematuria: Secondary | ICD-10-CM | POA: Insufficient documentation

## 2023-07-24 MED ORDER — IOHEXOL 300 MG/ML  SOLN
100.0000 mL | Freq: Once | INTRAMUSCULAR | Status: AC | PRN
  Administered 2023-07-24: 100 mL via INTRAVENOUS

## 2023-08-16 ENCOUNTER — Ambulatory Visit: Admitting: Urology

## 2023-08-16 VITALS — BP 130/60 | HR 89 | Ht 63.0 in | Wt 106.0 lb

## 2023-08-16 DIAGNOSIS — R339 Retention of urine, unspecified: Secondary | ICD-10-CM

## 2023-08-16 DIAGNOSIS — R3129 Other microscopic hematuria: Secondary | ICD-10-CM | POA: Diagnosis not present

## 2023-08-16 NOTE — Progress Notes (Signed)
   08/16/23  CC:  Chief Complaint  Patient presents with   Cysto    HPI: 73 year old female with a personal history of neurogenic bladder/incomplete bladder emptying who presents today for cystoscopy for hematuria workup.  To note CT urogram which was unremarkable other than evidence of constipation.  Her urinalysis today is negative.  Blood pressure 130/60, pulse 89, height 5\' 3"  (1.6 m), weight 106 lb (48.1 kg). NED. A&Ox3.   No respiratory distress   Abd soft, NT, ND Normal external genitalia with patent urethral meatus  Cystoscopy Procedure Note  Patient identification was confirmed, informed consent was obtained, and patient was prepped using Betadine solution.  Lidocaine  jelly was administered per urethral meatus.    Procedure: - Flexible cystoscope introduced, without any difficulty.   - Thorough search of the bladder revealed:    normal urethral meatus    normal urothelium    no stones    no ulcers     no tumors    no urethral polyps    no trabeculation  - Ureteral orifices were normal in position and appearance.  Post-Procedure: - Patient tolerated the procedure well  Assessment/ Plan:  1. Microscopic hematuria (Primary) Cystoscopy and CT urogram were reassuring.  Likely secondary to cath trauma. - Urinalysis, Complete  2. Urinary retention Continue self cath as needed.  She expressed desire to switch catheter companies to ones produced in the [place] , she will send a MyChart message after she does research on her preference.   Follow-up in 1 year with UA with Matilde Son

## 2023-08-17 LAB — URINALYSIS, COMPLETE
Bilirubin, UA: NEGATIVE
Glucose, UA: NEGATIVE
Ketones, UA: NEGATIVE
Leukocytes,UA: NEGATIVE
Nitrite, UA: NEGATIVE
Protein,UA: NEGATIVE
RBC, UA: NEGATIVE
Specific Gravity, UA: <1.005 — ABNORMAL LOW (ref 1.005–1.030)
Urobilinogen, Ur: 0.2 mg/dL (ref 0.2–1.0)
pH, UA: 6 (ref 5.0–7.5)

## 2023-08-17 LAB — MICROSCOPIC EXAMINATION
Epithelial Cells (non renal): >10 /HPF — AB (ref 0–10)
RBC, Urine: NONE SEEN /HPF (ref 0–2)

## 2024-02-26 ENCOUNTER — Ambulatory Visit: Payer: Self-pay | Admitting: Urology

## 2024-08-14 ENCOUNTER — Ambulatory Visit: Admitting: Urology
